# Patient Record
Sex: Male | Born: 1955
Health system: Southern US, Community
[De-identification: ages and names within clinical notes are randomized; demographics above are authoritative.]

## PROBLEM LIST (undated history)

## (undated) DIAGNOSIS — F329 Major depressive disorder, single episode, unspecified: Secondary | ICD-10-CM

## (undated) DIAGNOSIS — K648 Other hemorrhoids: Secondary | ICD-10-CM

## (undated) DIAGNOSIS — K219 Gastro-esophageal reflux disease without esophagitis: Secondary | ICD-10-CM

## (undated) DIAGNOSIS — F32A Depression, unspecified: Secondary | ICD-10-CM

## (undated) DIAGNOSIS — I499 Cardiac arrhythmia, unspecified: Secondary | ICD-10-CM

## (undated) DIAGNOSIS — K449 Diaphragmatic hernia without obstruction or gangrene: Secondary | ICD-10-CM

## (undated) DIAGNOSIS — K589 Irritable bowel syndrome without diarrhea: Secondary | ICD-10-CM

## (undated) DIAGNOSIS — M797 Fibromyalgia: Secondary | ICD-10-CM

## (undated) DIAGNOSIS — F419 Anxiety disorder, unspecified: Secondary | ICD-10-CM

## (undated) HISTORY — DX: Major depressive disorder, single episode, unspecified: F32.9

## (undated) HISTORY — DX: Cardiac arrhythmia, unspecified: I49.9

## (undated) HISTORY — DX: Fibromyalgia: M79.7

## (undated) HISTORY — DX: Gastro-esophageal reflux disease without esophagitis: K21.9

## (undated) HISTORY — DX: Other hemorrhoids: K64.8

## (undated) HISTORY — DX: Anxiety disorder, unspecified: F41.9

## (undated) HISTORY — DX: Irritable bowel syndrome, unspecified: K58.9

## (undated) HISTORY — DX: Depression, unspecified: F32.A

## (undated) HISTORY — DX: Diaphragmatic hernia without obstruction or gangrene: K44.9

---

## 2005-04-17 ENCOUNTER — Encounter: Admission: RE | Admit: 2005-04-17 | Discharge: 2005-04-17 | Payer: Self-pay | Admitting: Internal Medicine

## 2005-05-02 ENCOUNTER — Encounter: Admission: RE | Admit: 2005-05-02 | Discharge: 2005-05-02 | Payer: Self-pay | Admitting: Internal Medicine

## 2007-05-30 ENCOUNTER — Encounter: Payer: Self-pay | Admitting: Gastroenterology

## 2007-05-30 ENCOUNTER — Ambulatory Visit: Payer: Self-pay | Admitting: Gastroenterology

## 2007-05-30 DIAGNOSIS — K589 Irritable bowel syndrome without diarrhea: Secondary | ICD-10-CM

## 2007-05-30 DIAGNOSIS — K219 Gastro-esophageal reflux disease without esophagitis: Secondary | ICD-10-CM | POA: Insufficient documentation

## 2007-05-30 DIAGNOSIS — IMO0001 Reserved for inherently not codable concepts without codable children: Secondary | ICD-10-CM

## 2007-05-30 DIAGNOSIS — F329 Major depressive disorder, single episode, unspecified: Secondary | ICD-10-CM

## 2007-05-30 DIAGNOSIS — F32A Depression, unspecified: Secondary | ICD-10-CM | POA: Insufficient documentation

## 2007-07-04 ENCOUNTER — Ambulatory Visit: Payer: Self-pay | Admitting: Gastroenterology

## 2008-06-30 ENCOUNTER — Ambulatory Visit: Payer: Self-pay | Admitting: Gastroenterology

## 2008-08-13 ENCOUNTER — Ambulatory Visit: Payer: Self-pay | Admitting: Gastroenterology

## 2008-09-07 ENCOUNTER — Telehealth (INDEPENDENT_AMBULATORY_CARE_PROVIDER_SITE_OTHER): Payer: Self-pay

## 2008-09-07 ENCOUNTER — Encounter: Payer: Self-pay | Admitting: Gastroenterology

## 2008-09-07 DIAGNOSIS — K449 Diaphragmatic hernia without obstruction or gangrene: Secondary | ICD-10-CM | POA: Insufficient documentation

## 2008-10-04 ENCOUNTER — Ambulatory Visit: Payer: Self-pay | Admitting: Gastroenterology

## 2008-10-04 ENCOUNTER — Ambulatory Visit (HOSPITAL_COMMUNITY): Admission: RE | Admit: 2008-10-04 | Discharge: 2008-10-04 | Payer: Self-pay | Admitting: Gastroenterology

## 2008-10-11 ENCOUNTER — Encounter: Payer: Self-pay | Admitting: Gastroenterology

## 2008-10-22 HISTORY — PX: LAPAROSCOPIC NISSEN FUNDOPLICATION: SHX1932

## 2008-11-19 ENCOUNTER — Ambulatory Visit (HOSPITAL_COMMUNITY): Admission: RE | Admit: 2008-11-19 | Discharge: 2008-11-20 | Payer: Self-pay | Admitting: General Surgery

## 2008-11-19 HISTORY — PX: LAPAROSCOPIC NISSEN FUNDOPLICATION: SHX1932

## 2008-12-14 ENCOUNTER — Encounter: Payer: Self-pay | Admitting: Gastroenterology

## 2009-01-03 ENCOUNTER — Encounter: Payer: Self-pay | Admitting: Gastroenterology

## 2010-02-21 NOTE — Letter (Signed)
Summary: [facility]   Imported By: Lester [place] 01/27/2009 08:58:25  _____________________________________________________________________  External Attachment:    Type:   Image     Comment:   External Document

## 2010-04-13 IMAGING — CR DG CHEST 2V
2 series · 2 of 2 positions shown · non-contrast
Comparison: None

CLINICAL DATA: Gastroesophageal reflux disease.  Preoperative
evaluation.  Nonsmoker.  No current cardiac or pulmonary
complaints.  History negative for hypertension or diabetes.

CHEST - 2 VIEW

[w chest pa]
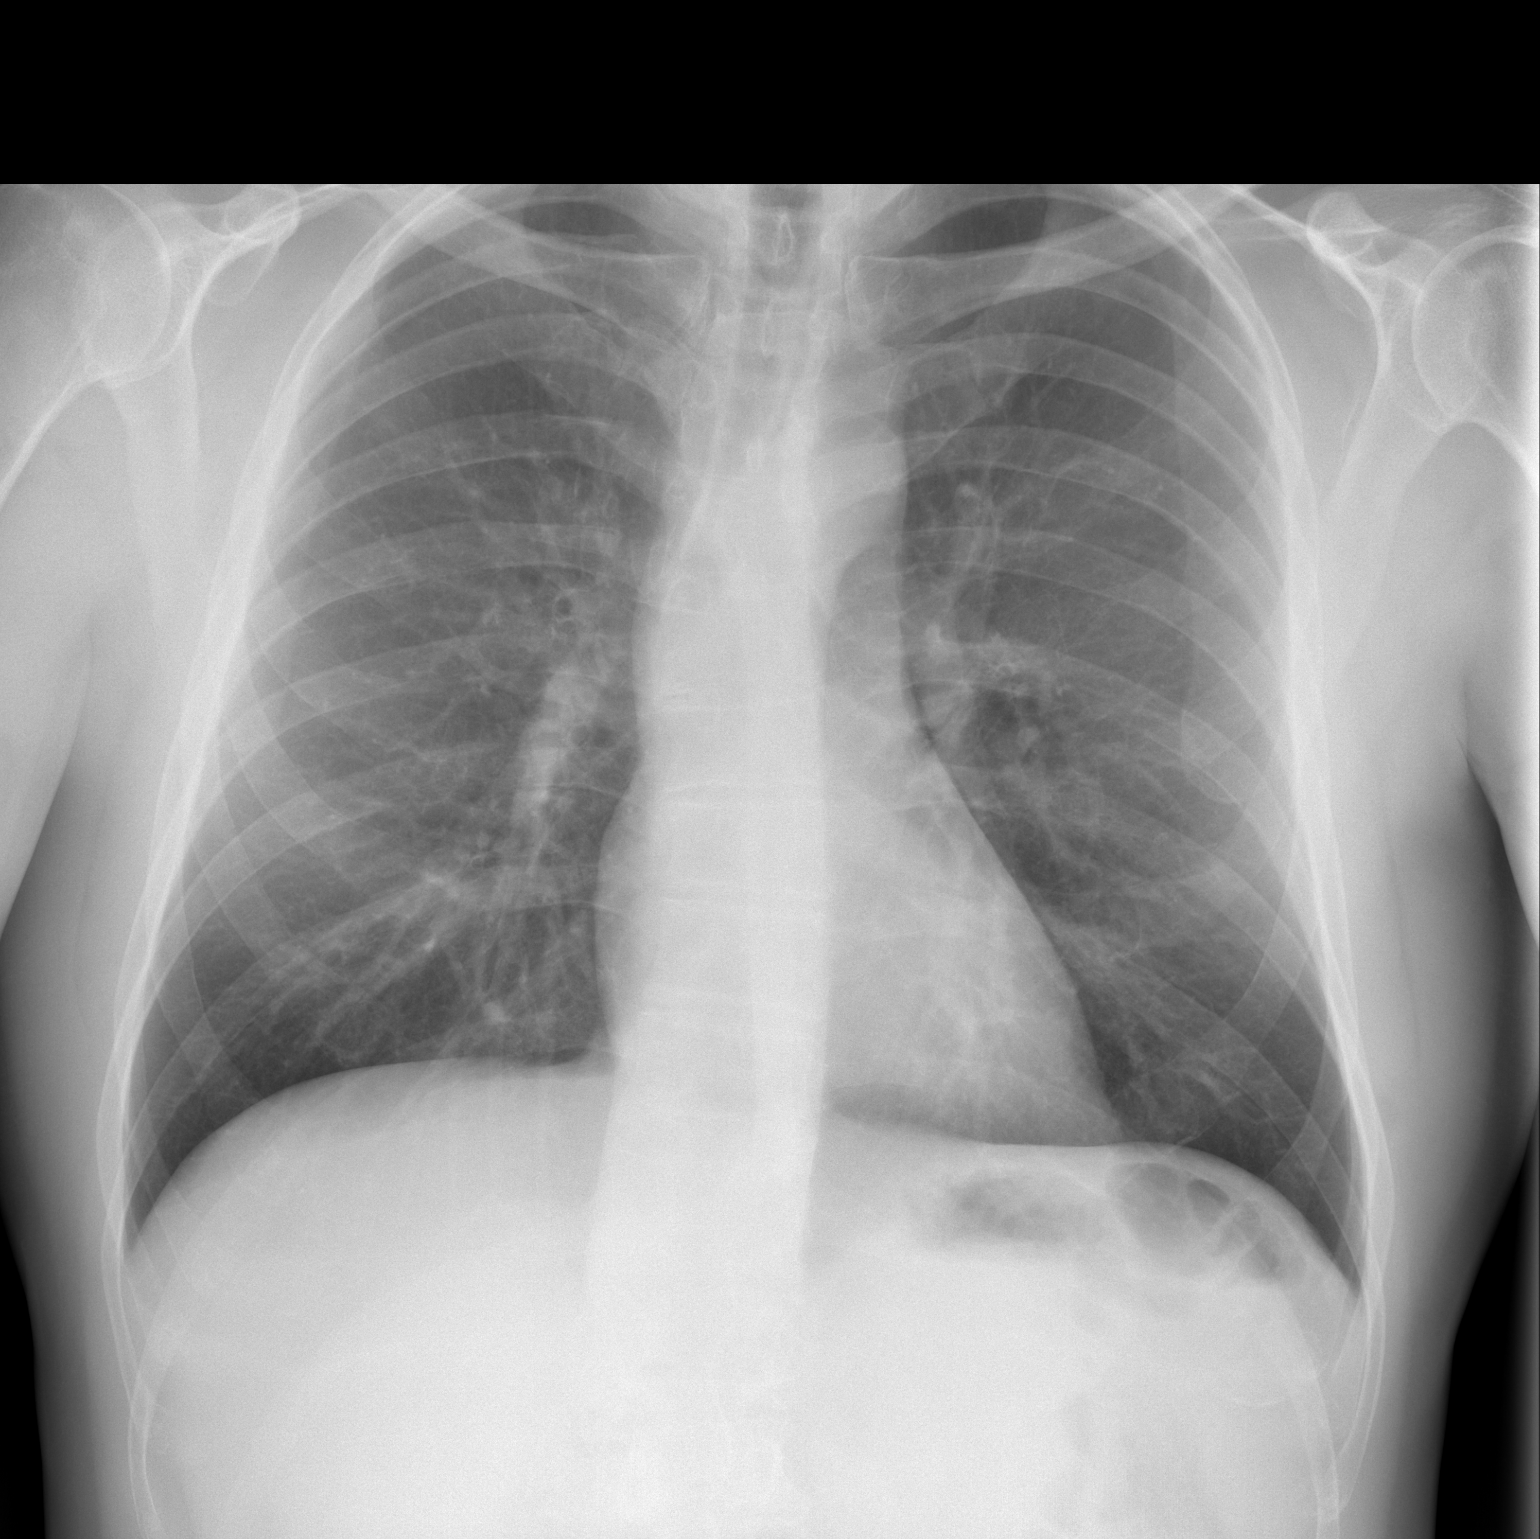

[w chest lat]
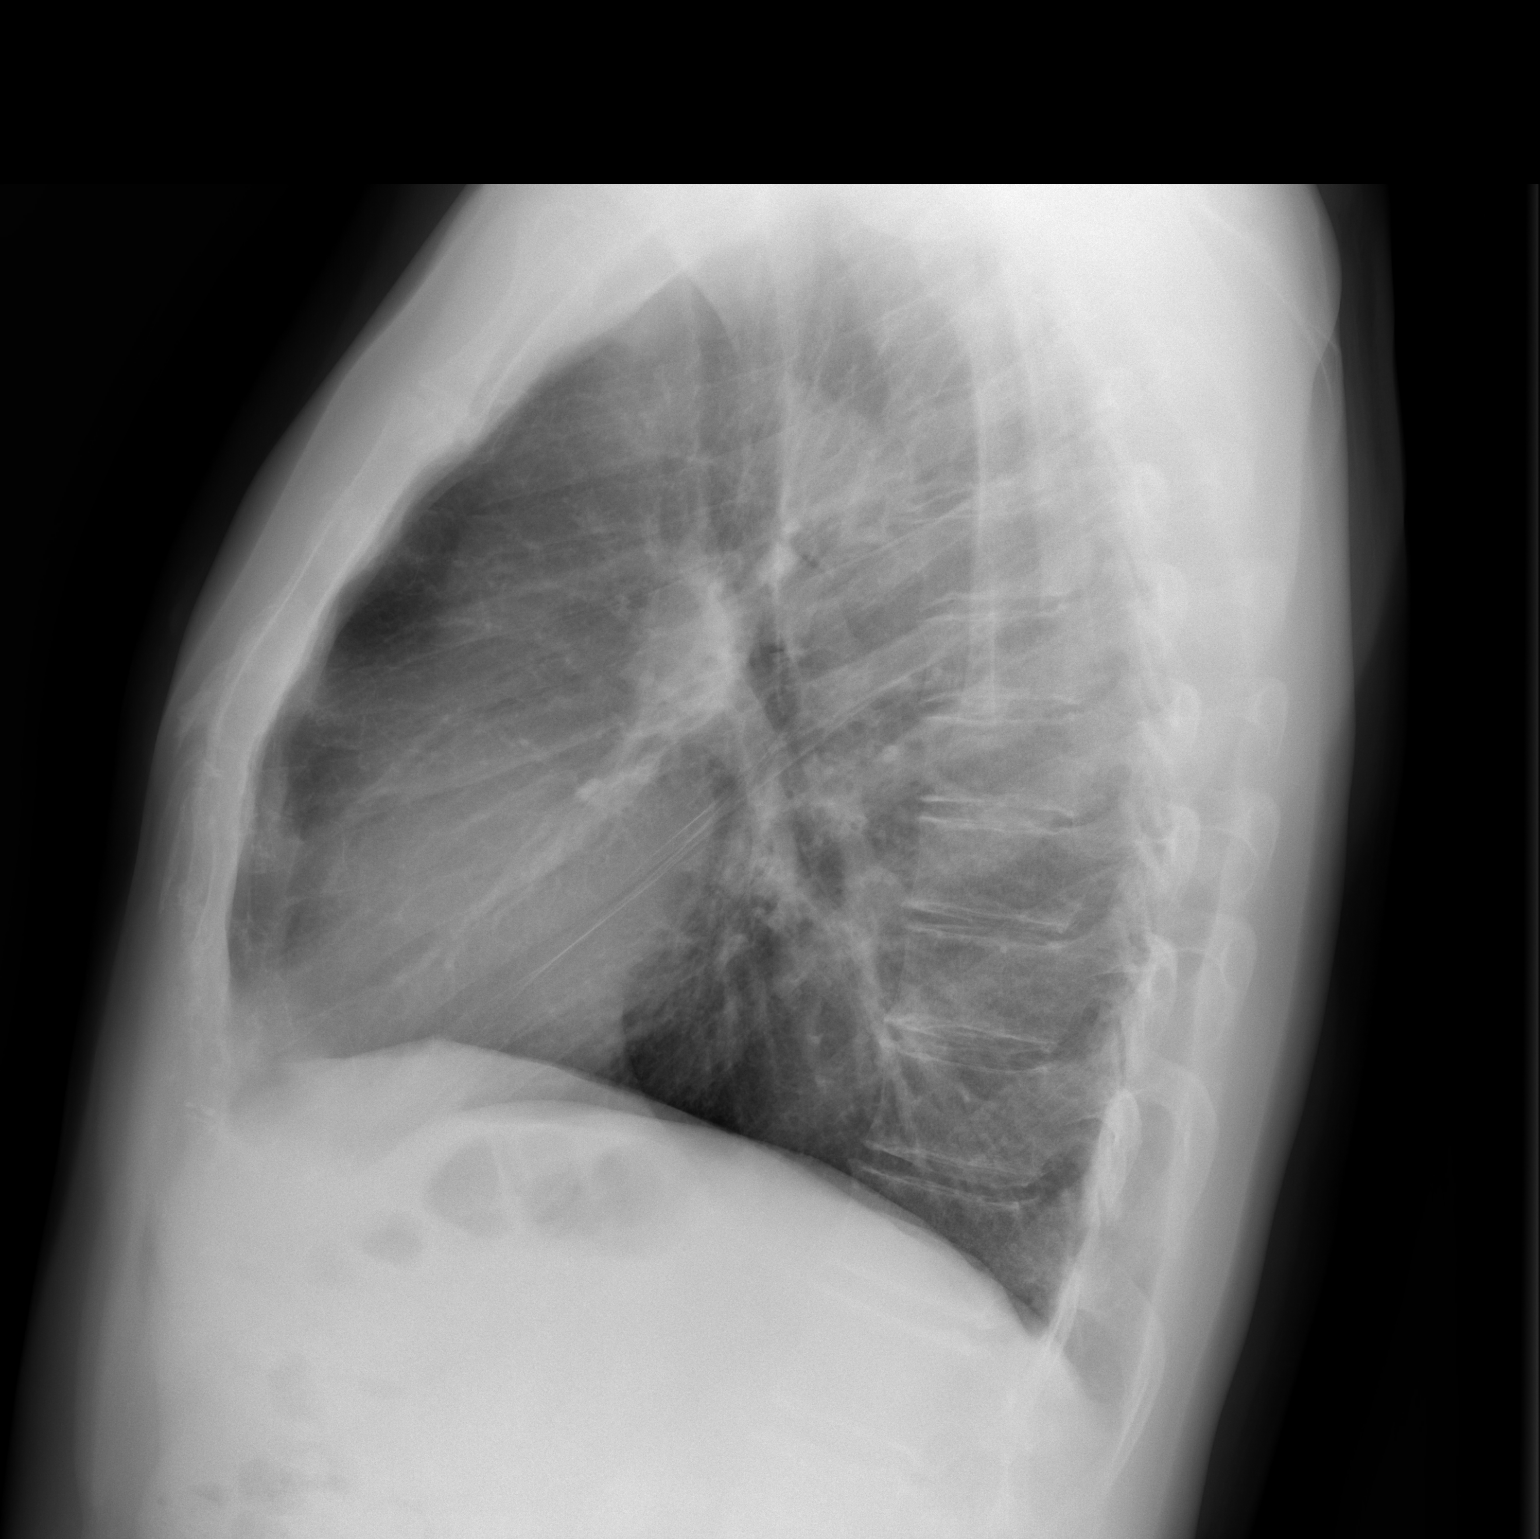

[2 of 2 positions shown; findings below may reference images not displayed]

FINDINGS: The lung fields are well-aerated.  Heart and mediastinal
contours are within normal limits.  No focal infiltrates or signs
of congestive failure are seen.  No pleural fluid is noted. Bony
structures appear intact with very mild curvature of the upper
thoracic spine noted. A mild compensatory curve is seen in the
lower thoracic spine.
IMPRESSION: No acute cardiopulmonary abnormality noted.

## 2010-04-27 LAB — DIFFERENTIAL
Basophils Absolute: 0 K/uL (ref 0.0–0.1)
Basophils Relative: 0 % (ref 0–1)
Eosinophils Absolute: 0.1 K/uL (ref 0.0–0.7)
Eosinophils Relative: 1 % (ref 0–5)
Lymphocytes Relative: 27 % (ref 12–46)
Lymphs Abs: 1.3 K/uL (ref 0.7–4.0)
Monocytes Absolute: 0.4 K/uL (ref 0.1–1.0)
Monocytes Relative: 8 % (ref 3–12)
Neutro Abs: 3.1 K/uL (ref 1.7–7.7)
Neutrophils Relative %: 64 % (ref 43–77)

## 2010-04-27 LAB — COMPREHENSIVE METABOLIC PANEL WITH GFR
ALT: 20 U/L (ref 0–53)
AST: 21 U/L (ref 0–37)
Albumin: 3.9 g/dL (ref 3.5–5.2)
Alkaline Phosphatase: 48 U/L (ref 39–117)
BUN: 13 mg/dL (ref 6–23)
CO2: 25 meq/L (ref 19–32)
Calcium: 9.1 mg/dL (ref 8.4–10.5)
Chloride: 107 meq/L (ref 96–112)
Creatinine, Ser: 0.94 mg/dL (ref 0.4–1.5)
GFR calc non Af Amer: >60 mL/min
Glucose, Bld: 99 mg/dL (ref 70–99)
Potassium: 4.2 meq/L (ref 3.5–5.1)
Sodium: 139 meq/L (ref 135–145)
Total Bilirubin: 0.8 mg/dL (ref 0.3–1.2)
Total Protein: 6.8 g/dL (ref 6.0–8.3)

## 2010-04-27 LAB — CBC
MCHC: 34.9 g/dL (ref 30.0–36.0)
MCV: 92.3 fL (ref 78.0–100.0)
Platelets: 192 10*3/uL (ref 150–400)
RBC: 4.73 MIL/uL (ref 4.22–5.81)
RDW: 13.7 % (ref 11.5–15.5)
WBC: 4.8 10*3/uL (ref 4.0–10.5)

## 2010-04-27 LAB — TYPE AND SCREEN
ABO/RH(D): O POS
Antibody Screen: NEGATIVE

## 2010-04-27 LAB — ABO/RH: ABO/RH(D): O POS

## 2010-07-18 ENCOUNTER — Emergency Department (HOSPITAL_COMMUNITY)
Admission: EM | Admit: 2010-07-18 | Discharge: 2010-07-18 | Disposition: A | Discharge status: Home / Self Care | Payer: BC Managed Care – PPO | Attending: Emergency Medicine | Admitting: Emergency Medicine

## 2010-07-18 DIAGNOSIS — Z23 Encounter for immunization: Secondary | ICD-10-CM | POA: Insufficient documentation

## 2010-07-21 ENCOUNTER — Inpatient Hospital Stay (INDEPENDENT_AMBULATORY_CARE_PROVIDER_SITE_OTHER)
Admission: RE | Admit: 2010-07-21 | Discharge: 2010-07-21 | Disposition: A | Discharge status: Home / Self Care | Payer: BC Managed Care – PPO | Source: Ambulatory Visit | Attending: Family Medicine | Admitting: Family Medicine

## 2010-07-21 DIAGNOSIS — Z23 Encounter for immunization: Secondary | ICD-10-CM

## 2010-07-25 ENCOUNTER — Inpatient Hospital Stay (INDEPENDENT_AMBULATORY_CARE_PROVIDER_SITE_OTHER)
Admission: RE | Admit: 2010-07-25 | Discharge: 2010-07-25 | Disposition: A | Discharge status: Home / Self Care | Payer: BC Managed Care – PPO | Source: Ambulatory Visit | Attending: Family Medicine | Admitting: Family Medicine

## 2010-07-25 DIAGNOSIS — Z23 Encounter for immunization: Secondary | ICD-10-CM

## 2010-08-01 ENCOUNTER — Inpatient Hospital Stay (INDEPENDENT_AMBULATORY_CARE_PROVIDER_SITE_OTHER)
Admission: RE | Admit: 2010-08-01 | Discharge: 2010-08-01 | Disposition: A | Discharge status: Home / Self Care | Payer: BC Managed Care – PPO | Source: Ambulatory Visit | Attending: Emergency Medicine | Admitting: Emergency Medicine

## 2010-08-01 DIAGNOSIS — Z23 Encounter for immunization: Secondary | ICD-10-CM

## 2010-11-03 ENCOUNTER — Other Ambulatory Visit: Payer: Self-pay | Admitting: Internal Medicine

## 2010-11-03 DIAGNOSIS — H9319 Tinnitus, unspecified ear: Secondary | ICD-10-CM

## 2010-11-07 ENCOUNTER — Ambulatory Visit
Admission: RE | Admit: 2010-11-07 | Discharge: 2010-11-07 | Disposition: A | Discharge status: Home / Self Care | Payer: BC Managed Care – PPO | Source: Ambulatory Visit | Attending: Internal Medicine | Admitting: Internal Medicine

## 2010-11-07 DIAGNOSIS — H9319 Tinnitus, unspecified ear: Secondary | ICD-10-CM

## 2010-11-07 MED ORDER — GADOBENATE DIMEGLUMINE 529 MG/ML IV SOLN
17.0000 mL | Freq: Once | INTRAVENOUS | Status: AC | PRN
  Administered 2010-11-07: 17 mL via INTRAVENOUS

## 2010-11-16 ENCOUNTER — Encounter: Payer: Self-pay | Admitting: Gastroenterology

## 2010-11-16 ENCOUNTER — Ambulatory Visit (INDEPENDENT_AMBULATORY_CARE_PROVIDER_SITE_OTHER): Payer: BC Managed Care – PPO | Admitting: Gastroenterology

## 2010-11-16 ENCOUNTER — Other Ambulatory Visit (INDEPENDENT_AMBULATORY_CARE_PROVIDER_SITE_OTHER): Payer: BC Managed Care – PPO

## 2010-11-16 VITALS — BP 124/76 | HR 90 | Ht 74.0 in | Wt 190.0 lb

## 2010-11-16 DIAGNOSIS — R197 Diarrhea, unspecified: Secondary | ICD-10-CM

## 2010-11-16 MED ORDER — RIFAXIMIN 550 MG PO TABS: 550.0000 mg | ORAL_TABLET | Freq: Two times a day (BID) | ORAL | Status: DC

## 2010-11-16 NOTE — Progress Notes (Signed)
History of Present Illness: This is a 55 year old male that I seen in the past. He underwent screening colonoscopy in July 2010 showing small internal hemorrhoids. He has a long history of constipation predominant irritable bowel syndrome however approximately 2 years ago he developed mild diarrhea which increased in severity over the course of several months to 5 or 6 loose, watery , urgent bowel movements daily. He was evaluated on several occasions by Dr. Ananias Pilgrim. I have reviewed the records he provided. He was treated with a course of Xifaxan for possible bacterial overgrowth and had some improvement in bloating but his diarrhea has not changed. He had microscopic stool analyses done by Dr. Ananias Pilgrim, through a veterinarian's office, showing Giardia and tapeworm. He was treated with 2 courses of antibiotics for both infections with no change in his diarrhea. He states he is a 15 pound weight loss over the past 2 years. He placed himself on a gluten-free diet which helps but did not eliminate his diarrhea. He also avoids high fiber foods, fresh fruits and vegetables and milk products which all help to control his diarrhea. He currently has about 3 loose bowel movements per day and he occasionally notes a small amount of bright red blood per rectum he attributes to hemorrhoids. He denies any travel outside the country in the past 7-10 years. Denies weight loss, abdominal pain, constipation, diarrhea, change in stool caliber, melena, hematochezia, nausea, vomiting, dysphagia, reflux symptoms, chest pain.  Current Medications, Allergies, Past Medical History, Past Surgical History, Family History and Social History were reviewed in [employer] record.  Physical Exam: General: Well developed , well nourished, no acute distress Head: Normocephalic and atraumatic Eyes:  sclerae anicteric, EOMI Ears: Normal auditory acuity Mouth: No deformity or lesions Lungs: Clear throughout to  auscultation Heart: Regular rate and rhythm; no murmurs, rubs or bruits Abdomen: Soft, non tender and non distended. No masses, hepatosplenomegaly or hernias noted. Normal Bowel sounds Musculoskeletal: Symmetrical with no gross deformities  Pulses:  Normal pulses noted Extremities: No clubbing, cyanosis, edema or deformities noted Neurological: Alert oriented x 4, grossly nonfocal Psychological:  Alert and cooperative. Normal mood and affect  Assessment and Recommendations:  1. Chronic diarrhea. Possible celiac disease with incomplete removal of gluten-free from his diet. Rule out a persistent Giardia or tapeworm infection, inflammatory bowel disease, malabsorption, bacterial overgrowth and microscopic colitis. Repeat standard stool studies and a fecal elastase. Proceed with another course of Xifaxan after stool studies are obtained. Obtain TSH and a celiac antibody profile. Consider colonoscopy and EGD.

## 2010-11-16 NOTE — Patient Instructions (Signed)
Your physician has requested that you go to the basement for the following lab work before leaving today:  You have been given Hemoccult cards, please follow the instructions on the cards and mail back to this office.   We have given you instructions for your breath test scheduled for tomorrow morning.   Dr. Russella Dar would like you to follow up with him in 2-3 weeks.   cc: Maryclare Bean.D.

## 2010-11-17 LAB — CELIAC PANEL 10
Endomysial Screen: NEGATIVE
Gliadin IgA: 9.5 U/mL (ref <20)
Gliadin IgG: 4.4 U/mL (ref <20)
Tissue Transglut Ab: 6.3 U/mL (ref <20)

## 2014-07-09 ENCOUNTER — Encounter: Payer: Self-pay | Admitting: Gastroenterology

## 2016-10-23 ENCOUNTER — Observation Stay (HOSPITAL_COMMUNITY)
Admission: EM | Admit: 2016-10-23 | Discharge: 2016-10-24 | Disposition: A | Discharge status: Home / Self Care | Payer: BLUE CROSS/BLUE SHIELD | Attending: Family Medicine | Admitting: Family Medicine

## 2016-10-23 ENCOUNTER — Emergency Department (HOSPITAL_COMMUNITY): Payer: BLUE CROSS/BLUE SHIELD

## 2016-10-23 ENCOUNTER — Encounter (HOSPITAL_COMMUNITY): Payer: Self-pay | Admitting: Emergency Medicine

## 2016-10-23 DIAGNOSIS — F419 Anxiety disorder, unspecified: Secondary | ICD-10-CM | POA: Insufficient documentation

## 2016-10-23 DIAGNOSIS — R002 Palpitations: Secondary | ICD-10-CM | POA: Diagnosis not present

## 2016-10-23 DIAGNOSIS — R778 Other specified abnormalities of plasma proteins: Secondary | ICD-10-CM

## 2016-10-23 DIAGNOSIS — R748 Abnormal levels of other serum enzymes: Secondary | ICD-10-CM | POA: Diagnosis present

## 2016-10-23 DIAGNOSIS — Z8249 Family history of ischemic heart disease and other diseases of the circulatory system: Secondary | ICD-10-CM | POA: Diagnosis not present

## 2016-10-23 DIAGNOSIS — K219 Gastro-esophageal reflux disease without esophagitis: Secondary | ICD-10-CM | POA: Insufficient documentation

## 2016-10-23 DIAGNOSIS — R0602 Shortness of breath: Secondary | ICD-10-CM | POA: Diagnosis present

## 2016-10-23 DIAGNOSIS — Z79811 Long term (current) use of aromatase inhibitors: Secondary | ICD-10-CM | POA: Diagnosis not present

## 2016-10-23 DIAGNOSIS — Z79899 Other long term (current) drug therapy: Secondary | ICD-10-CM | POA: Insufficient documentation

## 2016-10-23 DIAGNOSIS — E785 Hyperlipidemia, unspecified: Secondary | ICD-10-CM | POA: Insufficient documentation

## 2016-10-23 DIAGNOSIS — R000 Tachycardia, unspecified: Secondary | ICD-10-CM | POA: Diagnosis not present

## 2016-10-23 DIAGNOSIS — R7989 Other specified abnormal findings of blood chemistry: Secondary | ICD-10-CM | POA: Diagnosis present

## 2016-10-23 DIAGNOSIS — F329 Major depressive disorder, single episode, unspecified: Secondary | ICD-10-CM | POA: Insufficient documentation

## 2016-10-23 LAB — BRAIN NATRIURETIC PEPTIDE: B NATRIURETIC PEPTIDE 5: 110.1 pg/mL — AB (ref 0.0–100.0)

## 2016-10-23 LAB — BASIC METABOLIC PANEL
ANION GAP: 5 (ref 5–15)
BUN: 18 mg/dL (ref 6–20)
CHLORIDE: 107 mmol/L (ref 101–111)
CO2: 22 mmol/L (ref 22–32)
Calcium: 9 mg/dL (ref 8.9–10.3)
Creatinine, Ser: 1.04 mg/dL (ref 0.61–1.24)
Glucose, Bld: 133 mg/dL — ABNORMAL HIGH (ref 65–99)
POTASSIUM: 3.8 mmol/L (ref 3.5–5.1)
SODIUM: 134 mmol/L — AB (ref 135–145)

## 2016-10-23 LAB — CBC
HEMATOCRIT: 40 % (ref 39.0–52.0)
Hemoglobin: 13.9 g/dL (ref 13.0–17.0)
MCH: 31.2 pg (ref 26.0–34.0)
MCHC: 34.8 g/dL (ref 30.0–36.0)
MCV: 89.9 fL (ref 78.0–100.0)
Platelets: 200 10*3/uL (ref 150–400)
RBC: 4.45 MIL/uL (ref 4.22–5.81)
RDW: 13.2 % (ref 11.5–15.5)
WBC: 5.3 10*3/uL (ref 4.0–10.5)

## 2016-10-23 LAB — I-STAT TROPONIN, ED: Troponin i, poc: 0.03 ng/mL (ref 0.00–0.08)

## 2016-10-23 NOTE — ED Triage Notes (Signed)
Pt. Stated, I woke up this morning with some palpitations, sweaty, and SOB. Went to see my doctor and he said to come here my Troponin was abnormal.

## 2016-10-24 DIAGNOSIS — R002 Palpitations: Secondary | ICD-10-CM | POA: Diagnosis not present

## 2016-10-24 DIAGNOSIS — R748 Abnormal levels of other serum enzymes: Secondary | ICD-10-CM

## 2016-10-24 DIAGNOSIS — R7989 Other specified abnormal findings of blood chemistry: Secondary | ICD-10-CM

## 2016-10-24 DIAGNOSIS — K219 Gastro-esophageal reflux disease without esophagitis: Secondary | ICD-10-CM | POA: Diagnosis not present

## 2016-10-24 DIAGNOSIS — R000 Tachycardia, unspecified: Secondary | ICD-10-CM | POA: Insufficient documentation

## 2016-10-24 DIAGNOSIS — R778 Other specified abnormalities of plasma proteins: Secondary | ICD-10-CM | POA: Diagnosis present

## 2016-10-24 DIAGNOSIS — F419 Anxiety disorder, unspecified: Secondary | ICD-10-CM

## 2016-10-24 DIAGNOSIS — R0602 Shortness of breath: Secondary | ICD-10-CM | POA: Diagnosis present

## 2016-10-24 LAB — LIPID PANEL
CHOLESTEROL: 174 mg/dL (ref 0–200)
HDL: 54 mg/dL (ref >40)
LDL Cholesterol: 114 mg/dL — ABNORMAL HIGH (ref 0–99)
TRIGLYCERIDES: 32 mg/dL (ref <150)
Total CHOL/HDL Ratio: 3.2 RATIO
VLDL: 6 mg/dL (ref 0–40)

## 2016-10-24 LAB — RAPID URINE DRUG SCREEN, HOSP PERFORMED
Amphetamines: NOT DETECTED
BARBITURATES: NOT DETECTED
Benzodiazepines: POSITIVE — AB
COCAINE: NOT DETECTED
Opiates: NOT DETECTED
Tetrahydrocannabinol: NOT DETECTED

## 2016-10-24 LAB — HEPARIN LEVEL (UNFRACTIONATED): HEPARIN UNFRACTIONATED: 0.56 [IU]/mL (ref 0.30–0.70)

## 2016-10-24 LAB — TSH: TSH: 3.571 u[IU]/mL (ref 0.350–4.500)

## 2016-10-24 LAB — TROPONIN I
Troponin I: 0.05 ng/mL (ref <0.03)
Troponin I: 0.04 ng/mL (ref <0.03)
Troponin I: 0.03 ng/mL (ref <0.03)

## 2016-10-24 LAB — HEMOGLOBIN A1C
HEMOGLOBIN A1C: 5.2 % (ref 4.8–5.6)
Mean Plasma Glucose: 102.54 mg/dL

## 2016-10-24 LAB — T4, FREE: Free T4: 0.74 ng/dL (ref 0.61–1.12)

## 2016-10-24 LAB — HIV ANTIBODY (ROUTINE TESTING W REFLEX): HIV SCREEN 4TH GENERATION: NONREACTIVE

## 2016-10-24 MED ORDER — ASPIRIN 81 MG PO CHEW
324.0000 mg | CHEWABLE_TABLET | Freq: Once | ORAL | Status: AC
  Administered 2016-10-24: 324 mg via ORAL
  Filled 2016-10-24: qty 4

## 2016-10-24 MED ORDER — ALPRAZOLAM 0.25 MG PO TABS: 0.2500 mg | ORAL_TABLET | Freq: Two times a day (BID) | ORAL | Status: DC | PRN

## 2016-10-24 MED ORDER — SODIUM CHLORIDE 0.9 % IV SOLN
INTRAVENOUS | Status: DC
  Administered 2016-10-24: 04:00:00 via INTRAVENOUS

## 2016-10-24 MED ORDER — ALBUTEROL SULFATE (2.5 MG/3ML) 0.083% IN NEBU: 2.5000 mg | INHALATION_SOLUTION | Freq: Four times a day (QID) | RESPIRATORY_TRACT | Status: DC | PRN

## 2016-10-24 MED ORDER — ONDANSETRON HCL 4 MG/2ML IJ SOLN: 4.0000 mg | Freq: Four times a day (QID) | INTRAMUSCULAR | Status: DC | PRN

## 2016-10-24 MED ORDER — TEMAZEPAM 15 MG PO CAPS: 15.0000 mg | ORAL_CAPSULE | Freq: Every evening | ORAL | Status: DC | PRN

## 2016-10-24 MED ORDER — HEPARIN BOLUS VIA INFUSION
4500.0000 [IU] | Freq: Once | INTRAVENOUS | Status: AC
  Administered 2016-10-24: 4500 [IU] via INTRAVENOUS
  Filled 2016-10-24: qty 4500

## 2016-10-24 MED ORDER — MIRTAZAPINE 15 MG PO TABS
30.0000 mg | ORAL_TABLET | Freq: Every day | ORAL | Status: DC
  Filled 2016-10-24: qty 1

## 2016-10-24 MED ORDER — HYDRALAZINE HCL 20 MG/ML IJ SOLN: 5.0000 mg | INTRAMUSCULAR | Status: DC | PRN

## 2016-10-24 MED ORDER — HEPARIN (PORCINE) IN NACL 100-0.45 UNIT/ML-% IJ SOLN
1300.0000 [IU]/h | INTRAMUSCULAR | Status: DC
  Administered 2016-10-24: 1300 [IU]/h via INTRAVENOUS
  Filled 2016-10-24: qty 250

## 2016-10-24 MED ORDER — PANTOPRAZOLE SODIUM 40 MG PO TBEC: 40.0000 mg | DELAYED_RELEASE_TABLET | Freq: Every day | ORAL | Status: DC | PRN

## 2016-10-24 MED ORDER — NITROGLYCERIN 0.4 MG SL SUBL: 0.4000 mg | SUBLINGUAL_TABLET | SUBLINGUAL | Status: DC | PRN

## 2016-10-24 MED ORDER — ACETAMINOPHEN 325 MG PO TABS: 650.0000 mg | ORAL_TABLET | ORAL | Status: DC | PRN

## 2016-10-24 MED ORDER — MAGNESIUM OXIDE 400 (241.3 MG) MG PO TABS
200.0000 mg | ORAL_TABLET | Freq: Every day | ORAL | Status: DC
  Filled 2016-10-24: qty 0.5

## 2016-10-24 MED ORDER — ZOLPIDEM TARTRATE 5 MG PO TABS: 10.0000 mg | ORAL_TABLET | Freq: Every evening | ORAL | Status: DC | PRN

## 2016-10-24 MED ORDER — ASPIRIN 81 MG PO CHEW
324.0000 mg | CHEWABLE_TABLET | Freq: Every day | ORAL | Status: DC
  Filled 2016-10-24: qty 4

## 2016-10-24 NOTE — Consult Note (Signed)
Cardiology Consultation:   Patient ID: Calvin Conner; [id number]; August 21, 1955   Admit date: 10/23/2016 Date of Consult: 10/24/2016  Primary Care Provider: Lollie Sails, MD Primary Cardiologist: new - Dr. Debara Pickett Primary Electrophysiologist:     Patient Profile:   Calvin Conner is a 61 y.o. male with a hx of GERD, IBS, fibromyalgia, and depression who is being seen today for the evaluation of palpitations and elevated troponin at the request of Dr. Blaine Hamper.  History of Present Illness:   [name] states that he has had palpitations/heart racing for the past 2-3 years. Palpitations occur about 3-4 times each year and last about 5-10 minutes before spontaneously resolving. He has not sought medical management for these palpitations.  His father has a history of Afib, no family history of heart disease.  Yesterday morning, he started having a similar episode of palpitations/heart racing; however, this episode lasted about 3 hours. He went to his PCP at [facility] He was told he had an abnormal EKG. Troponin came back mildly positive at 0.12 (per patient) and he was instructed to report to the [facility].     On arrival, EKG with NSR. Troponin was mildly elevated and flat, inconsistent with ACS and like due to 3 hours of tachycardia.   He denies chest pain, SOB, dizziness, lightheadedness, nausea, vomiting, diaphoresis, and syncope. He states that during the episode, he became dyspneic on exertion. Currenlty, he is resting comfortably and is NSR.  EKG obtained from [facility] with NSR in the 70s, possible left atrial enlargement.  Past Medical History:  Diagnosis Date  . Depression   . Fibromyalgia   . GERD (gastroesophageal reflux disease)    w/ LA Grade A erosive esophagitis  . Hiatal hernia   . IBS (irritable bowel syndrome)   . Internal hemorrhoids     Past Surgical History:  Procedure Laterality Date  . LAPAROSCOPIC NISSEN FUNDOPLICATION  01/6008     Home  Medications:  Prior to Admission medications   Medication Sig Start Date End Date Taking? Authorizing Provider  MAGNESIUM PO Take 1 tablet by mouth every morning.   Yes [provider]  mirtazapine (REMERON) 30 MG tablet Take 30 mg by mouth at bedtime.   Yes [provider]  temazepam (RESTORIL) 15 MG capsule Take 15 mg by mouth at bedtime as needed for sleep.   Yes [provider]  zolpidem (AMBIEN) 10 MG tablet Take 10 mg by mouth at bedtime as needed for sleep.   Yes [provider]    Inpatient Medications: Scheduled Meds: . aspirin  324 mg Oral Daily  . magnesium oxide  200 mg Oral Daily  . mirtazapine  30 mg Oral QHS   Continuous Infusions: . sodium chloride 75 mL/hr at 10/24/16 0424  . heparin 1,300 Units/hr (10/24/16 0423)   PRN Meds: acetaminophen, albuterol, ALPRAZolam, hydrALAZINE, nitroGLYCERIN, ondansetron (ZOFRAN) IV, pantoprazole, temazepam, zolpidem  Allergies:    Allergies  Allergen Reactions  . Penicillins Other (See Comments)    Childhood allergy     Social History:   Social History   Social History  . Marital status: Married    Spouse name: N/A  . Number of children: 2  . Years of education: N/A   Occupational History  . Not on file.   Social History Main Topics  . Smoking status: Never Smoker  . Smokeless tobacco: Never Used  . Alcohol use No  . Drug use: No  . Sexual activity: Not  on file   Other Topics Concern  . Not on file   Social History Narrative  . No narrative on file    Family History:    Family History  Problem Relation Age of Onset  . Breast cancer Mother   . Colon cancer Neg Hx      ROS:  Please see the history of present illness.  ROS  All other ROS reviewed and negative.     Physical Exam/Data:   Vitals:   10/24/16 0900 10/24/16 0930 10/24/16 1030 10/24/16 1100  BP: 113/79 112/77 115/85 (!) 128/94  Pulse: 62 63 78 70  Resp: 14 13 (!) 21 19  Temp:      TempSrc:      SpO2:  97% 96% 97% 97%  Weight:      Height:       No intake or output data in the 24 hours ending 10/24/16 1104 Filed Weights   10/23/16 1902  Weight: 200 lb (90.7 kg)   Body mass index is 25.68 kg/m.  General:  Well nourished, well developed, in no acute distress HEENT: normal Neck: no JVD Vascular: No carotid bruits Cardiac:  normal S1, S2; RRR; no murmur Lungs:  clear to auscultation bilaterally, no wheezing, rhonchi or rales  Abd: soft, nontender, no hepatomegaly  Ext: no edema Musculoskeletal:  No deformities, BUE and BLE strength normal and equal Skin: warm and dry  Neuro:  CNs 2-12 intact, no focal abnormalities noted Psych:  Normal affect   EKG:  The EKG was personally reviewed and demonstrates:  NSR Telemetry:  Telemetry was personally reviewed and demonstrates:  NSR  Relevant CV Studies:  Echocardiogram: pending  Laboratory Data:  Chemistry Recent Labs Lab 10/23/16 1924  NA 134*  K 3.8  CL 107  CO2 22  GLUCOSE 133*  BUN 18  CREATININE 1.04  CALCIUM 9.0  GFRNONAA >60  GFRAA >60  ANIONGAP 5    No results for input(s): PROT, ALBUMIN, AST, ALT, ALKPHOS, BILITOT in the last 168 hours. Hematology Recent Labs Lab 10/23/16 1924  WBC 5.3  RBC 4.45  HGB 13.9  HCT 40.0  MCV 89.9  MCH 31.2  MCHC 34.8  RDW 13.2  PLT 200   Cardiac Enzymes Recent Labs Lab 10/23/16 2303 10/24/16 0303 10/24/16 0914  TROPONINI 0.05* 0.04* 0.03*    Recent Labs Lab 10/23/16 1943  TROPIPOC 0.03    BNP Recent Labs Lab 10/23/16 2303  BNP 110.1*    DDimer No results for input(s): DDIMER in the last 168 hours.  Radiology/Studies:  Dg Chest 2 View  Result Date: 10/23/2016 CLINICAL DATA:  61 year old male with history of shortness of breath. Rapid heart rate. EXAM: CHEST  2 VIEW COMPARISON:  Chest x-ray 10/23/2016. FINDINGS: The heart size and mediastinal contours are within normal limits. Both lungs are clear. The visualized skeletal structures are unremarkable.  IMPRESSION: No active cardiopulmonary disease. Electronically Signed   By: Vinnie Langton M.D.   On: 10/23/2016 20:39    Assessment and Plan:   1. Palpitations - primary team started heparin - TSH WNL, K WNL, Mg pending - E[facility] obtained from [facility] obtained yesterday with NSR and possible left atrial enlargement - echo pending - will monitor on telemetry today - if no further arrhythmia or tachycardia, consider implantable loop recorder - he is currently not on rate controlling medications   2. Elevated troponin - troponin 0.03 --> 0.05 --> 0.04 --> 0.03 - EKG without signs of acute ischemia - BNP 110.1 -  echo pending Echo results will guide decisions concerning ischemic evaluation.   3. HLD - LDL 114, Triglycerides 32, HDL 54 - would benefit from a statin medication if lifestyle modifications do not lower LDL    For questions or updates, please contact [place] Please consult [email or web address] for contact info under Cardiology/STEMI.   Signed, Tami Lin Carlon Davidson, PA  10/24/2016 11:04 AM

## 2016-10-24 NOTE — ED Notes (Addendum)
Spoke with MD Sarajane Jews who states he spoke with cardiology and they are aware of pt and plan to see pt.

## 2016-10-24 NOTE — Progress Notes (Signed)
Received consult from Dr Debara Pickett to consider ILR for evaluation of infrequent palpitations and family history of AF.  Unfortunately, we are not able to implant today.  I have discussed with patient and we will plan ILR implant Friday morning. Pt to arrive at 6:30AM to short stay.  Full consult note will be done then with Dr Lovena Le.  Ok per Dr Debara Pickett to discharge to home with outpatient follow up. No reason from EP standpoint to admit patient. I have reached out to admitting MD to let them know.  Chanetta Marshall, NP 10/24/2016 1:54 PM

## 2016-10-24 NOTE — Progress Notes (Signed)
ANTICOAGULATION CONSULT NOTE - Initial Consult  Pharmacy Consult for heparin Indication: new afib, elevated troponin   Allergies  Allergen Reactions  . Penicillins Other (See Comments)    Childhood allergy     Patient Measurements: Height: 6\' 2"  (188 cm) Weight: 200 lb (90.7 kg) IBW/kg (Calculated) : 82.2 Heparin Dosing Weight: 90.7 kg   Vital Signs: Temp: 98.1 F (36.7 C) (10/02 1901) Temp Source: Oral (10/02 1901) BP: 128/85 (10/03 0230) Pulse Rate: 68 (10/03 0230)  Labs:  Recent Labs  10/23/16 1924 10/23/16 2303  HGB 13.9  --   HCT 40.0  --   PLT 200  --   CREATININE 1.04  --   TROPONINI  --  0.05*    Estimated Creatinine Clearance: 84.5 mL/min (by C-G formula based on SCr of 1.04 mg/dL).   Medical History: Past Medical History:  Diagnosis Date  . Depression   . Fibromyalgia   . GERD (gastroesophageal reflux disease)    w/ LA Grade A erosive esophagitis  . Hiatal hernia   . IBS (irritable bowel syndrome)   . Internal hemorrhoids     Assessment: 61 yo male admitted with SOB and elevated troponin. Pharmacy consulted to dose heparin for possible new onset afib and elevated troponin. No oral anticoagulation PTA per medication list.    CBC stable and no s/s bleeding noted.   Goal of Therapy:  Heparin level 0.3-0.7 units/ml Monitor platelets by anticoagulation protocol: Yes   Plan:  Heparin 4500 units IV x1 Start heparin gtt at 1300 units/hr Heparin level in 6 hrs Daily heparin level and CBC Monitor for s/s bleeding   Argie Ramming, PharmD Clinical Pharmacist 10/24/16 3:19 AM

## 2016-10-24 NOTE — ED Notes (Signed)
Family at bedside. 

## 2016-10-24 NOTE — ED Notes (Signed)
Admitting paged because pt would like an update on plan of care.

## 2016-10-24 NOTE — H&P (Signed)
History and Physical    Calvin Conner:[id number] DOB: 1955/02/13 DOA: 10/23/2016  Referring MD/NP/PA:   PCP: Lollie Sails, MD   Patient coming from:  The patient is coming from home.  At baseline, pt is independent for most of ADL.  Chief Complaint: Palpitation, shortness of breath  HPI: Calvin Conner is a 61 y.o. male with medical history significant of GERD, depression, anxiety, fibromyalgia, IBS, who presents with palpitations and shortness breath.  Patient states that he started having palpitation, heart racing in this morning, which lasted for about 3 hours. It is associated with shortness of breath and diaphoresis. No chest pain, cough, fever or chills. Patient was seen by PCP, found to have positive troponin 0.12. Patient had EKG done in PCPs office, which showed "irregular heartbeat and possible A fib" per pt. Patient denies GI symptoms, symptoms of UTI or unilateral weakness. Currently no chest pain or shortness breath.  ED Course: pt was found to have troponin 0.05, BNP 110.1, WBC 5.3, electrolytes renal function okay, temperature normal, tachycardia, O2 sat 96% on room air, negative chest x-ray. Patient is placed on telemetry bed for observation.  Review of Systems:   General: no fevers, chills, no body weight gain, has fatigue HEENT: no blurry vision, hearing changes or sore throat Respiratory: has dyspnea, no coughing, wheezing CV: no chest pain, has palpitations GI: no nausea, vomiting, abdominal pain, diarrhea, constipation GU: no dysuria, burning on urination, increased urinary frequency, hematuria  Ext: no leg edema Neuro: no unilateral weakness, numbness, or tingling, no vision change or hearing loss Skin: no rash, no skin tear. MSK: No muscle spasm, no deformity, no limitation of range of movement in spin Heme: No easy bruising.  Travel history: No recent long distant travel.  Allergy:  Allergies  Allergen Reactions  . Penicillins Other (See Comments)      Childhood allergy     Past Medical History:  Diagnosis Date  . Depression   . Fibromyalgia   . GERD (gastroesophageal reflux disease)    w/ LA Grade A erosive esophagitis  . Hiatal hernia   . IBS (irritable bowel syndrome)   . Internal hemorrhoids     Past Surgical History:  Procedure Laterality Date  . LAPAROSCOPIC NISSEN FUNDOPLICATION  58/5277    Social History:  reports that he has never smoked. He has never used smokeless tobacco. He reports that he does not drink alcohol or use drugs.  Family History:  Family History  Problem Relation Age of Onset  . Breast cancer Mother   . Colon cancer Neg Hx      Prior to Admission medications   Medication Sig Start Date End Date Taking? Authorizing Provider  ALINIA 500 MG tablet TAKES 2 TABLETS BY MOUTH TWICE DAILY  11/10/10   [provider]  AMBULATORY NON FORMULARY MEDICATION TESTOSTERONE INJ 0.7 ONCE A WEEK     [provider]  anastrozole (ARIMIDEX) 1 MG tablet Take 1 mg by mouth daily.      [provider]  methylphenidate (RITALIN) 20 MG tablet Take 20 mg by mouth daily.      [provider]  rifaximin (XIFAXAN) 550 MG TABS Take 1 tablet (550 mg total) by mouth 2 (two) times daily. 11/16/10   Ladene Artist, MD    Physical Exam: Vitals:   10/24/16 0200 10/24/16 0230 10/24/16 0300 10/24/16 0330  BP: 119/78 128/85 113/78 121/74  Pulse: (!) 58 68 (!) 59 (!) 58  Resp: 14 16 15  13  Temp:      TempSrc:      SpO2: 98% 97% 99% 97%  Weight:      Height:       General: Not in acute distress HEENT:       Eyes: PERRL, EOMI, no scleral icterus.       ENT: No discharge from the ears and nose, no pharynx injection, no tonsillar enlargement.        Neck: No JVD, no bruit, no mass felt. Heme: No neck lymph node enlargement. Cardiac: S1/S2, RRR, No murmurs, No gallops or rubs. Respiratory:  No rales, wheezing, rhonchi or rubs. GI: Soft, nondistended, nontender, no rebound pain, no  organomegaly, BS present. GU: No hematuria Ext: No pitting leg edema bilaterally. 2+DP/PT pulse bilaterally. Musculoskeletal: No joint deformities, No joint redness or warmth, no limitation of ROM in spin. Skin: No rashes.  Neuro: Alert, oriented X3, cranial nerves II-XII grossly intact, moves all extremities normally. Psych: Patient is not psychotic, no suicidal or hemocidal ideation.  Labs on Admission: I have personally reviewed following labs and imaging studies  CBC:  Recent Labs Lab 10/23/16 1924  WBC 5.3  HGB 13.9  HCT 40.0  MCV 89.9  PLT 829   Basic Metabolic Panel:  Recent Labs Lab 10/23/16 1924  NA 134*  K 3.8  CL 107  CO2 22  GLUCOSE 133*  BUN 18  CREATININE 1.04  CALCIUM 9.0   GFR: Estimated Creatinine Clearance: 84.5 mL/min (by C-G formula based on SCr of 1.04 mg/dL). Liver Function Tests: No results for input(s): AST, ALT, ALKPHOS, BILITOT, PROT, ALBUMIN in the last 168 hours. No results for input(s): LIPASE, AMYLASE in the last 168 hours. No results for input(s): AMMONIA in the last 168 hours. Coagulation Profile: No results for input(s): INR, PROTIME in the last 168 hours. Cardiac Enzymes:  Recent Labs Lab 10/23/16 2303 10/24/16 0303  TROPONINI 0.05* 0.04*   BNP (last 3 results) No results for input(s): PROBNP in the last 8760 hours. HbA1C:  Recent Labs  10/24/16 0303  HGBA1C 5.2   CBG: No results for input(s): GLUCAP in the last 168 hours. Lipid Profile: No results for input(s): CHOL, HDL, LDLCALC, TRIG, CHOLHDL, LDLDIRECT in the last 72 hours. Thyroid Function Tests:  Recent Labs  10/24/16 0025  TSH 3.571  FREET4 0.74   Anemia Panel: No results for input(s): VITAMINB12, FOLATE, FERRITIN, TIBC, IRON, RETICCTPCT in the last 72 hours. Urine analysis: No results found for: COLORURINE, APPEARANCEUR, LABSPEC, PHURINE, GLUCOSEU, HGBUR, BILIRUBINUR, KETONESUR, PROTEINUR, UROBILINOGEN, NITRITE, LEUKOCYTESUR Sepsis  Labs: @LABRCNTIP (procalcitonin:4,lacticidven:4) )No results found for this or any previous visit (from the past 240 hour(s)).   Radiological Exams on Admission: Dg Chest 2 View  Result Date: 10/23/2016 CLINICAL DATA:  61 year old male with history of shortness of breath. Rapid heart rate. EXAM: CHEST  2 VIEW COMPARISON:  Chest x-ray 10/23/2016. FINDINGS: The heart size and mediastinal contours are within normal limits. Both lungs are clear. The visualized skeletal structures are unremarkable. IMPRESSION: No active cardiopulmonary disease. Electronically Signed   By: Vinnie Langton M.D.   On: 10/23/2016 20:39     EKG: Independently reviewed.  Sinus rhythm, QTC 422, nonspecific T-wave change, LAE.   Assessment/Plan Principal Problem:   Palpitation Active Problems:   Depression   GERD   Elevated troponin   SOB (shortness of breath)   Anxiety   Palpitation and possible PAF: Etiology for patient's palpitation is not clear. Patient states that EKG done in PCPs office showed  irregular heart beat and possible atrial fibrillation. CHA2DS2-VASc Score is 0, will not need oral anticoagulation. Given new onset of possible A fib and positive trop, will start iv heparin.   - will place on Tele bed for obs - start IV heparin - check TSH, Free T4  Elevated trop: trop 0.05. No CP. Likely due to demand ischemia secondary to heart racing. - cycle CE q6 x3 and repeat EKG in the am  - prn Nitroglycerin, and aspirin - Risk factor stratification: will check FLP, UDS and A1C  - 2d echo - Inpatient non-urgent consult order was put in Epic and message to [employer] was sent out.  GERD: -Protonix prn  Depression and anxiety: Stable, no suicidal or homicidal ideations. -Continue home medications: Remeron and temazepam  SOB: Likely secondary to heart racing. No chest pain. No oxygen desaturation, no signs of DVT, less likely to have PE. X-rays negative. -When necessary albuterol nebulizers   DVT  ppx: on IV Heparin  Code Status: Full code Family Communication: None at bed side.    Disposition Plan:  Anticipate discharge back to previous home environment Consults called:  none Admission status: Obs / tele    Date of Service 10/24/2016    Ivor Costa [facility] Pager [phone]  If 7PM-7AM, please contact night-coverage [email or web address] Password [email or web address] 10/24/2016, 4:44 AM

## 2016-10-24 NOTE — ED Provider Notes (Signed)
[facility] DEPT Provider Note   CSN: [id number] Arrival date & time: 10/23/16  6803     History   Chief Complaint Chief Complaint  Patient presents with  . Abnormal Lab  . Shortness of Breath  . Palpitations    HPI Calvin Conner is a 61 y.o. male.  The history is provided by the patient and medical records.    61 y.o. M with hx of depression, fibromyalgia, GERD, IBS, hemorrhoids, presenting to the ED for abnormal test results.  Patient reports this morning he experienced palpitations and rapid HR.  Reports this lasted for about 3 hours total before resolving spontaneously.  States he is not sure of anything that set it off or helped it resolved.  During this event he reports he was sweaty and felt lightheaded with SOB.  Never had any chest pain.  States he has had episodes in the past like this but never lasted this long.  He was evaluated by his PCP who drew some labs and called him this afternoon and told him to come to the ED with elevated troponin.  States he feels fine currently, still no CP.  No known cardiac history.  Does have family hx of AFIB.  No thyroid issues.  Not a smoker.  No hx of DVT or PE.  Past Medical History:  Diagnosis Date  . Depression   . Fibromyalgia   . GERD (gastroesophageal reflux disease)    w/ LA Grade A erosive esophagitis  . Hiatal hernia   . IBS (irritable bowel syndrome)   . Internal hemorrhoids     Patient Active Problem List   Diagnosis Date Noted  . HIATAL HERNIA 09/07/2008  . DEPRESSION 05/30/2007  . GERD 05/30/2007  . IRRITABLE BOWEL SYNDROME 05/30/2007  . FIBROMYALGIA 05/30/2007    Past Surgical History:  Procedure Laterality Date  . LAPAROSCOPIC NISSEN FUNDOPLICATION  21/2248       Home Medications    Prior to Admission medications   Medication Sig Start Date End Date Taking? Authorizing Provider  ALINIA 500 MG tablet TAKES 2 TABLETS BY MOUTH TWICE DAILY  11/10/10   [provider]  AMBULATORY NON  FORMULARY MEDICATION TESTOSTERONE INJ 0.7 ONCE A WEEK     [provider]  anastrozole (ARIMIDEX) 1 MG tablet Take 1 mg by mouth daily.      [provider]  methylphenidate (RITALIN) 20 MG tablet Take 20 mg by mouth daily.      [provider]  rifaximin (XIFAXAN) 550 MG TABS Take 1 tablet (550 mg total) by mouth 2 (two) times daily. 11/16/10   Ladene Artist, MD    Family History Family History  Problem Relation Age of Onset  . Breast cancer Mother   . Colon cancer Neg Hx     Social History Social History  Substance Use Topics  . Smoking status: Never Smoker  . Smokeless tobacco: Never Used  . Alcohol use No     Allergies   Penicillins   Review of Systems Review of Systems  Respiratory: Positive for shortness of breath.   Cardiovascular: Positive for palpitations.  All other systems reviewed and are negative.    Physical Exam Updated Vital Signs BP 114/81 (BP Location: Right Arm)   Pulse (!) 58   Temp 98.1 F (36.7 C) (Oral)   Resp 18   Ht 6\' 2"  (1.88 m)   Wt 90.7 kg (200 lb)   SpO2 96%   BMI 25.68 kg/m  Physical Exam  Constitutional: He is oriented to person, place, and time. He appears well-developed and well-nourished.  HENT:  Head: Normocephalic and atraumatic.  Mouth/Throat: Oropharynx is clear and moist.  Eyes: Pupils are equal, round, and reactive to light. Conjunctivae and EOM are normal.  Neck: Normal range of motion.  Cardiovascular: Normal rate, regular rhythm and normal heart sounds.   Pulmonary/Chest: Effort normal and breath sounds normal. No respiratory distress. He has no wheezes.  Abdominal: Soft. Bowel sounds are normal. There is no tenderness. There is no rebound.  Musculoskeletal: Normal range of motion.  Neurological: He is alert and oriented to person, place, and time.  Skin: Skin is warm and dry.  Psychiatric: He has a normal mood and affect.  Nursing note and vitals reviewed.    ED Treatments /  Results  Labs (all labs ordered are listed, but only abnormal results are displayed) Labs Reviewed  BASIC METABOLIC PANEL - Abnormal; Notable for the following:       Result Value   Sodium 134 (*)    Glucose, Bld 133 (*)    All other components within normal limits  TROPONIN I - Abnormal; Notable for the following:    Troponin I 0.05 (*)    All other components within normal limits  BRAIN NATRIURETIC PEPTIDE - Abnormal; Notable for the following:    B Natriuretic Peptide 110.1 (*)    All other components within normal limits  CBC  TSH  T4, FREE  I-STAT TROPONIN, ED    EKG  EKG Interpretation  Date/Time:  Tuesday October 23 2016 19:03:34 EDT Ventricular Rate:  69 PR Interval:  180 QRS Duration: 86 QT Interval:  394 QTC Calculation: 422 R Axis:   51 Text Interpretation:  Normal sinus rhythm Normal ECG Nonspecific ST abnormality - lateral leads No significant change since last tracing Confirmed by Varney Biles [phone]) on 10/23/2016 11:04:12 PM       Radiology Dg Chest 2 View  Result Date: 10/23/2016 CLINICAL DATA:  61 year old male with history of shortness of breath. Rapid heart rate. EXAM: CHEST  2 VIEW COMPARISON:  Chest x-ray 10/23/2016. FINDINGS: The heart size and mediastinal contours are within normal limits. Both lungs are clear. The visualized skeletal structures are unremarkable. IMPRESSION: No active cardiopulmonary disease. Electronically Signed   By: Vinnie Langton M.D.   On: 10/23/2016 20:39    Procedures Procedures (including critical care time)  CRITICAL CARE Performed by: Larene Pickett   Total critical care time: 35 minutes  Critical care time was exclusive of separately billable procedures and treating other patients.  Critical care was necessary to treat or prevent imminent or life-threatening deterioration.  Critical care was time spent personally by me on the following activities: development of treatment plan with patient and/or  surrogate as well as nursing, discussions with consultants, evaluation of patient's response to treatment, examination of patient, obtaining history from patient or surrogate, ordering and performing treatments and interventions, ordering and review of laboratory studies, ordering and review of radiographic studies, pulse oximetry and re-evaluation of patient's condition.   Medications Ordered in ED Medications - No data to display   Initial Impression / Assessment and Plan / ED Course  I have reviewed the triage vital signs and the nursing notes.  Pertinent labs & imaging results that were available during my care of the patient were reviewed by me and considered in my medical decision making (see chart for details).  61 year old male here after a three-hour period of  palpitations, shortness of breath, and lightheadedness.  Reports this resolved spontaneously. Was evaluated by his PCP this morning and told to come to the ED with elevated troponin. He has never had any chest pain. Currently asymptomatic. EKG with nonspecific ST changes. Troponin is elevated. 0.05. Remainder of labs overall reassuring. Chest x-ray is clear. Patient's symptoms are concerning for possible A. fib. Reports he has had episodes like this in the past, but never lasted this long so never had them evaluated. There is family history of A. fib. Will give dose of aspirin, send thyroid studies. Plan for admission to hospitalist service to trend troponin, tele monitoring.  Discussed with Dr. Blaine Hamper-- will admit for ongoing care.  Final Clinical Impressions(s) / ED Diagnoses   Final diagnoses:  Elevated troponin    New Prescriptions New Prescriptions   No medications on file     Larene Pickett, PA-C 10/24/16 0206    Varney Biles, MD 10/24/16 [phone]

## 2016-10-24 NOTE — Progress Notes (Signed)
[place] for heparin Indication: new afib, elevated troponin   Allergies  Allergen Reactions  . Penicillins Other (See Comments)    Childhood allergy     Patient Measurements: Height: 6\' 2"  (188 cm) Weight: 200 lb (90.7 kg) IBW/kg (Calculated) : 82.2 Heparin Dosing Weight: 90.7 kg   Vital Signs: BP: 120/83 (10/03 1130) Pulse Rate: 66 (10/03 1130)  Labs:  Recent Labs  10/23/16 1924 10/23/16 2303 10/24/16 0303 10/24/16 0914 10/24/16 1000  HGB 13.9  --   --   --   --   HCT 40.0  --   --   --   --   PLT 200  --   --   --   --   HEPARINUNFRC  --   --   --   --  0.56  CREATININE 1.04  --   --   --   --   TROPONINI  --  0.05* 0.04* 0.03*  --     Estimated Creatinine Clearance: 84.5 mL/min (by C-G formula based on SCr of 1.04 mg/dL).  Assessment: 61 yo male admitted with SOB and elevated troponin. Pharmacy consulted to dose heparin for possible new onset afib and elevated troponin. No oral anticoagulation PTA per medication list.    Initial heparin level is therapeutic at 0.56. No bleeding noted.   Goal of Therapy:  Heparin level 0.3-0.7 units/ml Monitor platelets by anticoagulation protocol: Yes   Plan:  Continue heparin gtt 1300 units/hr Daily heparin level and CBC  Salome Arnt, PharmD, BCPS Phone #: [phone] from 7am to 3:30pm All other times, call [facility] x [phone] 10/24/2016 12:44 PM

## 2016-10-24 NOTE — ED Notes (Signed)
Report given to [facility]  in ED. Pt being moved to another unit for holding until tele bed is available.

## 2016-10-24 NOTE — ED Notes (Signed)
cardiology PA at bedside

## 2016-10-24 NOTE — Discharge Summary (Signed)
Physician Discharge Summary  HERBERTH DEHARO OHY:[id number] DOB: Oct 24, 1955 DOA: 10/23/2016  PCP: Lollie Sails, MD  Admit date: 10/23/2016 Discharge date: 10/24/2016  Recommendations for Outpatient Follow-up:  1. Follow-up palpitations, plan as below   Follow-up Information    Lollie Sails, MD Follow up.   Specialty:  Internal Medicine Why:  as needed Contact information: [address] [place] [address] [phone]        Pixie Casino, MD Follow up.   Specialty:  Cardiology Why:  office will call you with appointment Contact information: [place] [address] [phone]        Evans Lance, MD Follow up.   Specialty:  Cardiology Why:  Friday 10/5 at 6:30 AM at [facility] information: [address] [place] [address] [phone]            Discharge Diagnoses:  1. Palpitations  Discharge Condition: elevated troponin Disposition: home  Diet recommendation: heart healthy  Filed Weights   10/23/16 1902  Weight: 90.7 kg (200 lb)    History of present illness:  61 year old woman PMH GERD presented with palpitations and shortness of breath lasting for about 3 hours. No chest pain. Seen by PCP, found to have a positive troponin and therefore sent to the emergency department. EKG in the outpatient setting reported to show irregular heartbeat with possible A. Fib. Admitted for palpitations and possible paroxysmal atrial fibrillation.  Hospital Course:  Observed. No further palpitations. Troponins flat, seen by cardiology who recommended implanted loop recorder in outpatient follow-up as below. Hospitalization was uncomplicated.  Palpitations, possible paroxysmal atrial fibrillation. -CHA2DS2-VASc 0 -cardiology recommends implantable loop recorder and will arrange for outpatient stress testing and echocardiogram with plans to follow-up with Dr. Debara Pickett as an outpatient -no  changes to medications at this point.  Elevated troponin secondary to presumed rapid heart rate.   Today's assessment: S:feeling fine. No palpitations O: Vitals: afebrile, 98.1, 17, 56, 113/77   Constitutional: appears calm, comfortable.  Cardiovascular. Regular rate and rhythm. No murmur, rub or gallop.  Respiratory. Clear to auscultation bilaterally. No wheezes, rales or rhonchi. Normal respiratory effort.  Psychiatric. Grossly normal mood and affect. Speech fluent and appropriate.   Basic metabolic panel unremarkable  Troponins flat 0.05, 0.04, 0.03  CBC unremarkable  TSH within normal limits  Hemoglobin A1c within normal limits  Urine drug screen positive for benzodiazepines  Chest x-ray and apparently reviewed, no acute disease  EKG sinus rhythm, no specific ST changes  Discharge Instructions  Discharge Instructions    Activity as tolerated - No restrictions    Complete by:  As directed    Diet - low sodium heart healthy    Complete by:  As directed    Discharge instructions    Complete by:  As directed    Call your physician or seek immediate medical attention for palpitations, chest pain, shortness of breath or worsening of condition.     Allergies as of 10/24/2016      Reactions   Penicillins Other (See Comments)   Childhood allergy       Medication List    TAKE these medications   MAGNESIUM PO Take 1 tablet by mouth every morning.   mirtazapine 30 MG tablet Commonly known as:  REMERON Take 30 mg by mouth at bedtime.   temazepam 15 MG capsule Commonly known as:  RESTORIL Take 15 mg by mouth at bedtime as needed for sleep.   zolpidem  10 MG tablet Commonly known as:  AMBIEN Take 10 mg by mouth at bedtime as needed for sleep.      Allergies  Allergen Reactions  . Penicillins Other (See Comments)    Childhood allergy     The results of significant diagnostics from this hospitalization (including imaging, microbiology, ancillary and  laboratory) are listed below for reference.    Significant Diagnostic Studies: Dg Chest 2 View  Result Date: 10/23/2016 CLINICAL DATA:  61 year old male with history of shortness of breath. Rapid heart rate. EXAM: CHEST  2 VIEW COMPARISON:  Chest x-ray 10/23/2016. FINDINGS: The heart size and mediastinal contours are within normal limits. Both lungs are clear. The visualized skeletal structures are unremarkable. IMPRESSION: No active cardiopulmonary disease. Electronically Signed   By: Vinnie Langton M.D.   On: 10/23/2016 20:39    Labs: Basic Metabolic Panel:  Recent Labs Lab 10/23/16 1924  NA 134*  K 3.8  CL 107  CO2 22  GLUCOSE 133*  BUN 18  CREATININE 1.04  CALCIUM 9.0   CBC:  Recent Labs Lab 10/23/16 1924  WBC 5.3  HGB 13.9  HCT 40.0  MCV 89.9  PLT 200   Cardiac Enzymes:  Recent Labs Lab 10/23/16 2303 10/24/16 0303 10/24/16 0914  TROPONINI 0.05* 0.04* 0.03*     Recent Labs  10/23/16 2303  BNP 110.1*    Principal Problem:   Palpitation Active Problems:   Depression   GERD   Elevated troponin   SOB (shortness of breath)   Anxiety   Time coordinating discharge: 35 minutes  Signed:  Murray Hodgkins, MD [facility] 10/24/2016, 4:01 PM

## 2016-10-25 ENCOUNTER — Telehealth: Payer: Self-pay | Admitting: Internal Medicine

## 2016-10-25 DIAGNOSIS — R0602 Shortness of breath: Secondary | ICD-10-CM

## 2016-10-25 DIAGNOSIS — R778 Other specified abnormalities of plasma proteins: Secondary | ICD-10-CM

## 2016-10-25 DIAGNOSIS — R002 Palpitations: Secondary | ICD-10-CM

## 2016-10-25 DIAGNOSIS — R7989 Other specified abnormal findings of blood chemistry: Principal | ICD-10-CM

## 2016-10-25 DIAGNOSIS — R9431 Abnormal electrocardiogram [ECG] [EKG]: Secondary | ICD-10-CM

## 2016-10-25 NOTE — Telephone Encounter (Signed)
New message    Pt was in ER yesterday and was told to schedule out pt stress and echo , need orders put in thank you

## 2016-10-25 NOTE — Telephone Encounter (Signed)
Exercise myoview and echo .Marland Kitchen He is having a loop recorder on Friday.   DR. Lemmie Evens

## 2016-10-25 NOTE — Telephone Encounter (Signed)
Patient seen in ED yesterday. Per MD note, patient should have outpatient stress test & echo. Will route to MD to determine what type of stress test - diagnoses.

## 2016-10-26 ENCOUNTER — Ambulatory Visit (HOSPITAL_COMMUNITY)
Admission: RE | Admit: 2016-10-26 | Discharge: 2016-10-26 | Disposition: A | Discharge status: Home / Self Care | Payer: BLUE CROSS/BLUE SHIELD | Source: Ambulatory Visit | Attending: Internal Medicine | Admitting: Internal Medicine

## 2016-10-26 ENCOUNTER — Encounter (HOSPITAL_COMMUNITY)
Admission: RE | Disposition: A | Discharge status: Home / Self Care | Payer: Self-pay | Source: Ambulatory Visit | Attending: Internal Medicine

## 2016-10-26 ENCOUNTER — Encounter (HOSPITAL_COMMUNITY): Payer: Self-pay | Admitting: Internal Medicine

## 2016-10-26 DIAGNOSIS — M797 Fibromyalgia: Secondary | ICD-10-CM | POA: Diagnosis not present

## 2016-10-26 DIAGNOSIS — K589 Irritable bowel syndrome without diarrhea: Secondary | ICD-10-CM | POA: Insufficient documentation

## 2016-10-26 DIAGNOSIS — R002 Palpitations: Secondary | ICD-10-CM | POA: Insufficient documentation

## 2016-10-26 DIAGNOSIS — Z8249 Family history of ischemic heart disease and other diseases of the circulatory system: Secondary | ICD-10-CM | POA: Diagnosis not present

## 2016-10-26 DIAGNOSIS — K219 Gastro-esophageal reflux disease without esophagitis: Secondary | ICD-10-CM | POA: Diagnosis not present

## 2016-10-26 DIAGNOSIS — F329 Major depressive disorder, single episode, unspecified: Secondary | ICD-10-CM | POA: Insufficient documentation

## 2016-10-26 DIAGNOSIS — Z880 Allergy status to penicillin: Secondary | ICD-10-CM | POA: Insufficient documentation

## 2016-10-26 HISTORY — PX: LOOP RECORDER INSERTION: EP1214

## 2016-10-26 SURGERY — LOOP RECORDER INSERTION

## 2016-10-26 MED ORDER — LIDOCAINE-EPINEPHRINE 1 %-1:100000 IJ SOLN
INTRAMUSCULAR | Status: AC
  Filled 2016-10-26: qty 1

## 2016-10-26 MED ORDER — LIDOCAINE-EPINEPHRINE 1 %-1:100000 IJ SOLN
INTRAMUSCULAR | Status: DC | PRN
  Administered 2016-10-26: 20 mL

## 2016-10-26 SURGICAL SUPPLY — 2 items
LOOP REVEAL LINQSYS (Prosthesis & Implant Heart) ×3 IMPLANT
PACK LOOP INSERTION (CUSTOM PROCEDURE TRAY) ×3 IMPLANT

## 2016-10-26 NOTE — Telephone Encounter (Signed)
Echo & exercise myoview ordered. Staff message to [employer] to schedule

## 2016-10-26 NOTE — Consult Note (Signed)
ELECTROPHYSIOLOGY CONSULT NOTE    Patient ID: Calvin Conner MRN: [id number], DOB/AGE: 1955/04/12 61 y.o.  Admit date: 10/26/2016 Date of Consult: 10/26/2016  Primary Physician: Lollie Sails, MD Primary Cardiologist: [place] Electrophysiologist: Lovena Le  Patient Profile: Calvin Conner is a 61 y.o. male with a history of GERD, IBS, and fibromyalgia who is being seen today for the evaluation of palpitations at the request of Dr Debara Pickett.  HPI:  Calvin Conner is a 61 y.o. male who presented to the ER earlier this week with symptomatic palpitations. He has had palpitations that have been intermittent for several years and usually occur every 9-12 months. He is unaware for triggers or relieving factors. With palpitations, he has some shortness of breath with exertion.  His episodes typically last for 5-10 minutes, however, the episode this week lasted for 3 hours and he came to the ER for further evaluation.  He has a family history of atrial fibrillation. He is active at home without functional limitations.   He is seeing Dr Debara Pickett soon with plans for echo and GXT.   He denies chest pain, palpitations, dyspnea, PND, orthopnea, nausea, vomiting, dizziness, syncope, edema, weight gain, or early satiety.  Past Medical History:  Diagnosis Date  . Depression   . Fibromyalgia   . GERD (gastroesophageal reflux disease)    w/ LA Grade A erosive esophagitis  . Hiatal hernia   . IBS (irritable bowel syndrome)   . Internal hemorrhoids      Surgical History:  Past Surgical History:  Procedure Laterality Date  . LAPAROSCOPIC NISSEN FUNDOPLICATION  59/5396     Prescriptions Prior to Admission  Medication Sig Dispense Refill Last Dose  . MAGNESIUM PO Take 1 tablet by mouth every morning.   Past Week at Unknown time  . mirtazapine (REMERON) 30 MG tablet Take 30 mg by mouth at bedtime.   10/22/2016  . temazepam (RESTORIL) 15 MG capsule Take 15 mg by mouth at bedtime as needed for sleep.    10/22/2016  . zolpidem (AMBIEN) 10 MG tablet Take 10 mg by mouth at bedtime as needed for sleep.   10/22/2016    Inpatient Medications:   Allergies:  Allergies  Allergen Reactions  . Penicillins Other (See Comments)    Childhood allergy     Social History   Social History  . Marital status: Married    Spouse name: N/A  . Number of children: 2  . Years of education: N/A   Occupational History  . Not on file.   Social History Main Topics  . Smoking status: Never Smoker  . Smokeless tobacco: Never Used  . Alcohol use No  . Drug use: No  . Sexual activity: Not on file   Other Topics Concern  . Not on file   Social History Narrative  . No narrative on file     Family History  Problem Relation Age of Onset  . Breast cancer Mother   . Colon cancer Neg Hx      Review of Systems: All other systems reviewed and are otherwise negative except as noted above.  Physical Exam: Vitals:   10/26/16 0619  BP: (P) 122/71  Pulse: (P) 75  Resp: (P) 18  Temp: (P) 98.3 F (36.8 C)  TempSrc: (P) Oral  SpO2: (P) 97%  Weight: (P) 200 lb (90.7 kg)  Height: (P) 6\' 2"  (1.88 m)    GEN- The patient is well appearing, alert and oriented x 3 today.   HEENT:  normocephalic, atraumatic; sclera clear, conjunctiva pink; hearing intact; oropharynx clear; neck supple Lungs- Clear to ausculation bilaterally, normal work of breathing.  No wheezes, rales, rhonchi Heart- Regular rate and rhythm  GI- soft, non-tender, non-distended, bowel sounds present Extremities- no clubbing, cyanosis, or edema  MS- no significant deformity or atrophy Skin- warm and dry, no rash or lesion Psych- euthymic mood, full affect Neuro- strength and sensation are intact  Labs:  Lab Results  Component Value Date   WBC 5.3 10/23/2016   HGB 13.9 10/23/2016   HCT 40.0 10/23/2016   MCV 89.9 10/23/2016   PLT 200 10/23/2016    Recent Labs Lab 10/23/16 1924  NA 134*  K 3.8  CL 107  CO2 22  BUN 18    CREATININE 1.04  CALCIUM 9.0  GLUCOSE 133*      Radiology/Studies: Dg Chest 2 View  Result Date: 10/23/2016 CLINICAL DATA:  61 year old male with history of shortness of breath. Rapid heart rate. EXAM: CHEST  2 VIEW COMPARISON:  Chest x-ray 10/23/2016. FINDINGS: The heart size and mediastinal contours are within normal limits. Both lungs are clear. The visualized skeletal structures are unremarkable. IMPRESSION: No active cardiopulmonary disease. Electronically Signed   By: Vinnie Langton M.D.   On: 10/23/2016 20:39    ZTI:WPYKD rhythm, rate 69 (personally reviewed)  TELEMETRY: sinus rhythm (from ER visit earlier this week) (personally reviewed)  Assessment/Plan: 1.  Palpitations The patient has symptomatic palpitations that are intermittent occurring every 9-12 months. These are associated with shortness of breath with exertion.  He has a family history of atrial fibrillation.  Because of intermittent nature of palpitations, there is no benefit from short term monitoring. Risks, benefits to ILR implant reviewed with the patient who wishes to proceed.    Signed, Chanetta Marshall 10/26/2016 6:40 AM  EP Attending  Patient seen and examined. Agree with above. The patient presents today for insertion of an ILR for monitoring of palpitations with a h/o atrial fib. I have reviewed the indications with the patient and he wishes to proceed.  Mikle Bosworth.D.

## 2016-10-26 NOTE — H&P (View-Only) (Signed)
ELECTROPHYSIOLOGY CONSULT NOTE    Patient ID: Calvin Conner MRN: [id number], DOB/AGE: Jul 09, 1955 61 y.o.  Admit date: 10/26/2016 Date of Consult: 10/26/2016  Primary Physician: Lollie Sails, MD Primary Cardiologist: [place] Electrophysiologist: Lovena Le  Patient Profile: Calvin Conner is a 61 y.o. male with a history of GERD, IBS, and fibromyalgia who is being seen today for the evaluation of palpitations at the request of Dr Debara Pickett.  HPI:  Calvin Conner is a 61 y.o. male who presented to the ER earlier this week with symptomatic palpitations. He has had palpitations that have been intermittent for several years and usually occur every 9-12 months. He is unaware for triggers or relieving factors. With palpitations, he has some shortness of breath with exertion.  His episodes typically last for 5-10 minutes, however, the episode this week lasted for 3 hours and he came to the ER for further evaluation.  He has a family history of atrial fibrillation. He is active at home without functional limitations.   He is seeing Dr Debara Pickett soon with plans for echo and GXT.   He denies chest pain, palpitations, dyspnea, PND, orthopnea, nausea, vomiting, dizziness, syncope, edema, weight gain, or early satiety.  Past Medical History:  Diagnosis Date  . Depression   . Fibromyalgia   . GERD (gastroesophageal reflux disease)    w/ Calvin Grade A erosive esophagitis  . Hiatal hernia   . IBS (irritable bowel syndrome)   . Internal hemorrhoids      Surgical History:  Past Surgical History:  Procedure Laterality Date  . LAPAROSCOPIC NISSEN FUNDOPLICATION  82/9937     Prescriptions Prior to Admission  Medication Sig Dispense Refill Last Dose  . MAGNESIUM PO Take 1 tablet by mouth every morning.   Past Week at Unknown time  . mirtazapine (REMERON) 30 MG tablet Take 30 mg by mouth at bedtime.   10/22/2016  . temazepam (RESTORIL) 15 MG capsule Take 15 mg by mouth at bedtime as needed for sleep.    10/22/2016  . zolpidem (AMBIEN) 10 MG tablet Take 10 mg by mouth at bedtime as needed for sleep.   10/22/2016    Inpatient Medications:   Allergies:  Allergies  Allergen Reactions  . Penicillins Other (See Comments)    Childhood allergy     Social History   Social History  . Marital status: Married    Spouse name: N/A  . Number of children: 2  . Years of education: N/A   Occupational History  . Not on file.   Social History Main Topics  . Smoking status: Never Smoker  . Smokeless tobacco: Never Used  . Alcohol use No  . Drug use: No  . Sexual activity: Not on file   Other Topics Concern  . Not on file   Social History Narrative  . No narrative on file     Family History  Problem Relation Age of Onset  . Breast cancer Mother   . Colon cancer Neg Hx      Review of Systems: All other systems reviewed and are otherwise negative except as noted above.  Physical Exam: Vitals:   10/26/16 0619  BP: (P) 122/71  Pulse: (P) 75  Resp: (P) 18  Temp: (P) 98.3 F (36.8 C)  TempSrc: (P) Oral  SpO2: (P) 97%  Weight: (P) 200 lb (90.7 kg)  Height: (P) 6\' 2"  (1.88 m)    GEN- The patient is well appearing, alert and oriented x 3 today.   HEENT:  normocephalic, atraumatic; sclera clear, conjunctiva pink; hearing intact; oropharynx clear; neck supple Lungs- Clear to ausculation bilaterally, normal work of breathing.  No wheezes, rales, rhonchi Heart- Regular rate and rhythm  GI- soft, non-tender, non-distended, bowel sounds present Extremities- no clubbing, cyanosis, or edema  MS- no significant deformity or atrophy Skin- warm and dry, no rash or lesion Psych- euthymic mood, full affect Neuro- strength and sensation are intact  Labs:  Lab Results  Component Value Date   WBC 5.3 10/23/2016   HGB 13.9 10/23/2016   HCT 40.0 10/23/2016   MCV 89.9 10/23/2016   PLT 200 10/23/2016    Recent Labs Lab 10/23/16 1924  NA 134*  K 3.8  CL 107  CO2 22  BUN 18    CREATININE 1.04  CALCIUM 9.0  GLUCOSE 133*      Radiology/Studies: Dg Chest 2 View  Result Date: 10/23/2016 CLINICAL DATA:  61 year old male with history of shortness of breath. Rapid heart rate. EXAM: CHEST  2 VIEW COMPARISON:  Chest x-ray 10/23/2016. FINDINGS: The heart size and mediastinal contours are within normal limits. Both lungs are clear. The visualized skeletal structures are unremarkable. IMPRESSION: No active cardiopulmonary disease. Electronically Signed   By: Vinnie Langton M.D.   On: 10/23/2016 20:39    UJW:JXBJY rhythm, rate 69 (personally reviewed)  TELEMETRY: sinus rhythm (from ER visit earlier this week) (personally reviewed)  Assessment/Plan: 1.  Palpitations The patient has symptomatic palpitations that are intermittent occurring every 9-12 months. These are associated with shortness of breath with exertion.  He has a family history of atrial fibrillation.  Because of intermittent nature of palpitations, there is no benefit from short term monitoring. Risks, benefits to ILR implant reviewed with the patient who wishes to proceed.    Signed, Chanetta Marshall 10/26/2016 6:40 AM  EP Attending  Patient seen and examined. Agree with above. The patient presents today for insertion of an ILR for monitoring of palpitations with a h/o atrial fib. I have reviewed the indications with the patient and he wishes to proceed.  Mikle Bosworth.D.

## 2016-10-26 NOTE — Telephone Encounter (Signed)
Patient scheduled for echo & myoview on 11/06/16

## 2016-10-26 NOTE — Interval H&P Note (Signed)
History and Physical Interval Note:  10/26/2016 7:37 AM  Calvin Conner  has presented today for surgery, with the diagnosis of palpitations  The various methods of treatment have been discussed with the patient and family. After consideration of risks, benefits and other options for treatment, the patient has consented to  Procedure(s): LOOP RECORDER INSERTION (N/A) as a surgical intervention .  The patient's history has been reviewed, patient examined, no change in status, stable for surgery.  I have reviewed the patient's chart and labs.  Questions were answered to the patient's satisfaction.     Cristopher Peru

## 2016-11-01 ENCOUNTER — Telehealth (HOSPITAL_COMMUNITY): Payer: Self-pay | Admitting: *Deleted

## 2016-11-01 ENCOUNTER — Telehealth (HOSPITAL_COMMUNITY): Payer: Self-pay

## 2016-11-01 ENCOUNTER — Telehealth (HOSPITAL_COMMUNITY): Payer: Self-pay | Admitting: Internal Medicine

## 2016-11-01 NOTE — Telephone Encounter (Signed)
Patient given detailed instructions per Myocardial Perfusion Study Information Sheet for the test on 11/06/16. Patient notified to arrive 15 minutes early and that it is imperative to arrive on time for appointment to keep from having the test rescheduled.  If you need to cancel or reschedule your appointment, please call the office within 24 hours of your appointment. . Patient verbalized understanding. Kirstie Peri

## 2016-11-01 NOTE — Telephone Encounter (Signed)
Encounter complete. 

## 2016-11-01 NOTE — Telephone Encounter (Signed)
User: Cherie Dark A Date/time: 11/01/16 10:40 AM  Comment: Called pt and lmsg for her to CB to change appt times for test on 10/16.   Context:  Outcome: Left Message  Phone number: [phone] Phone Type: Home Phone  Comm. type: Telephone Call type: Outgoing  Contact: Murray Hodgkins A Relation to patient: Self

## 2016-11-06 ENCOUNTER — Ambulatory Visit (HOSPITAL_COMMUNITY): Payer: BLUE CROSS/BLUE SHIELD | Attending: Cardiovascular Disease

## 2016-11-06 ENCOUNTER — Inpatient Hospital Stay (HOSPITAL_COMMUNITY): Admit: 2016-11-06 | Payer: BLUE CROSS/BLUE SHIELD

## 2016-11-06 ENCOUNTER — Other Ambulatory Visit: Payer: Self-pay

## 2016-11-06 ENCOUNTER — Ambulatory Visit (HOSPITAL_BASED_OUTPATIENT_CLINIC_OR_DEPARTMENT_OTHER): Payer: BLUE CROSS/BLUE SHIELD

## 2016-11-06 DIAGNOSIS — R7989 Other specified abnormal findings of blood chemistry: Principal | ICD-10-CM

## 2016-11-06 DIAGNOSIS — R0602 Shortness of breath: Secondary | ICD-10-CM | POA: Insufficient documentation

## 2016-11-06 DIAGNOSIS — R002 Palpitations: Secondary | ICD-10-CM | POA: Diagnosis not present

## 2016-11-06 DIAGNOSIS — R748 Abnormal levels of other serum enzymes: Secondary | ICD-10-CM

## 2016-11-06 DIAGNOSIS — R778 Other specified abnormalities of plasma proteins: Secondary | ICD-10-CM

## 2016-11-06 LAB — MYOCARDIAL PERFUSION IMAGING
CHL CUP MPHR: 157 {beats}/min
CHL CUP NUCLEAR SRS: 2
CHL CUP NUCLEAR SSS: 2
CSEPEW: 11.7 METS
CSEPPHR: 148 {beats}/min
Exercise duration (min): 10 min
LHR: 0.26
LV dias vol: 133 mL (ref 62–150)
LVSYSVOL: 67 mL
NUC STRESS TID: 0.95
Percent HR: 94 %
RPE: 18
Rest HR: 55 {beats}/min
SDS: 0

## 2016-11-06 LAB — ECHOCARDIOGRAM COMPLETE
Height: 74 in
Weight: 3200 oz

## 2016-11-06 MED ORDER — TECHNETIUM TC 99M TETROFOSMIN IV KIT
30.3000 | PACK | Freq: Once | INTRAVENOUS | Status: AC | PRN
  Administered 2016-11-06: 30.3 via INTRAVENOUS
  Filled 2016-11-06: qty 31

## 2016-11-06 MED ORDER — TECHNETIUM TC 99M TETROFOSMIN IV KIT
10.1000 | PACK | Freq: Once | INTRAVENOUS | Status: AC | PRN
  Administered 2016-11-06: 10.1 via INTRAVENOUS
  Filled 2016-11-06: qty 11

## 2016-11-07 ENCOUNTER — Ambulatory Visit (INDEPENDENT_AMBULATORY_CARE_PROVIDER_SITE_OTHER): Payer: Self-pay | Admitting: *Deleted

## 2016-11-07 DIAGNOSIS — R002 Palpitations: Secondary | ICD-10-CM

## 2016-11-07 LAB — CUP PACEART INCLINIC DEVICE CHECK
Date Time Interrogation Session: 20181017135023
MDC IDC PG IMPLANT DT: 20181005

## 2016-11-07 NOTE — Progress Notes (Signed)
Wound check in clinic s/p ILR implant. Steri strips removed. Wound well healed without redness or edema. Incision edges approximated. Normal ILR device function. Battery status: Good. R-waves 0.54mV. (1) symptom episode recorded post implant- NSR; demonstration, 0 tachy episodes, 0 pause episodes, 0 brady episodes. 0 AF episodes (0% burden). Monthly summary reports and ROV with GT in 3 months.

## 2016-11-22 ENCOUNTER — Encounter: Payer: Self-pay | Admitting: Internal Medicine

## 2016-11-22 ENCOUNTER — Ambulatory Visit (INDEPENDENT_AMBULATORY_CARE_PROVIDER_SITE_OTHER): Payer: BLUE CROSS/BLUE SHIELD | Admitting: Internal Medicine

## 2016-11-22 VITALS — BP 104/66 | HR 61 | Ht 74.0 in | Wt 205.0 lb

## 2016-11-22 DIAGNOSIS — R7989 Other specified abnormal findings of blood chemistry: Secondary | ICD-10-CM

## 2016-11-22 DIAGNOSIS — R748 Abnormal levels of other serum enzymes: Secondary | ICD-10-CM | POA: Diagnosis not present

## 2016-11-22 DIAGNOSIS — R778 Other specified abnormalities of plasma proteins: Secondary | ICD-10-CM

## 2016-11-22 DIAGNOSIS — R002 Palpitations: Secondary | ICD-10-CM

## 2016-11-22 DIAGNOSIS — R0602 Shortness of breath: Secondary | ICD-10-CM

## 2016-11-22 MED ORDER — METOPROLOL TARTRATE 25 MG PO TABS: ORAL_TABLET | ORAL | 0 refills | Status: DC

## 2016-11-22 NOTE — Progress Notes (Signed)
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 He has had episodes sporadically every 6-9 months for the past few years. He reports his father and grandfather are known to have a-fib. His brother has "labile" hypertension. He denied any chest pain, but was dyspneic with exertion during the event. He tried to feel his pulse, but it was "too fast" and "weak". He denies presyncope or syncope or CHF symptoms. EKG and telemetry reviewed here which shows NSR. Echo pending. Troponin was mildly elevated and flat, consistent with demand ischemia, however, may be suggestive of underlying CAD. There is a suggestion of left atrial enlargement on the EKG. On exam he is frustrated but in NAD, lungs clear, RRR, no murmur, extremities without edema, A&Ox3.  After seeing him in the emergency department and ruling out for acute coronary syndrome, I recommended outpatient stress testing and an echocardiogram.  In addition we referred him to EP for placement of an implanted loop recorder.  This was placed by Dr. Crissie Conner and has been interrogated, but has not yet demonstrated any atrial fibrillation.  His stress test was negative for ischemia and his echo showed normal systolic function with moderate diastolic dysfunction.  He remains asymptomatic.  He denies any recurrent palpitations or tachycardia since we saw him in the hospital.  PMHx:  Past Medical History:  Diagnosis Date  . Depression   . Fibromyalgia   . GERD (gastroesophageal reflux disease)    w/ LA Grade A erosive esophagitis  . Hiatal hernia   . IBS (irritable bowel syndrome)   . Internal hemorrhoids     Past Surgical History:  Procedure Laterality Date  . LAPAROSCOPIC NISSEN FUNDOPLICATION  70/2637  . LOOP RECORDER INSERTION N/A 10/26/2016   Procedure: LOOP RECORDER INSERTION;  Surgeon: Calvin Lance, MD;  Location: [facility];  Service: Cardiovascular;  Laterality: N/A;    FAMHx:  Family History  Problem Relation Age of Onset  . Breast cancer Mother   . Colon cancer Neg Hx     SOCHx:   reports that he has never smoked. He has never used smokeless tobacco. He reports that he does not drink alcohol or use drugs.  ALLERGIES:  Allergies  Allergen Reactions  . Penicillins Other (See Comments)    Childhood allergy     ROS: Pertinent items noted in HPI and remainder of comprehensive ROS otherwise negative.  HOME MEDS: Current Outpatient Prescriptions on File Prior to Visit  Medication Sig Dispense Refill  . MAGNESIUM PO Take 1 tablet by mouth every morning.    . mirtazapine (REMERON) 30 MG tablet Take 30 mg by mouth at bedtime.    . temazepam (RESTORIL) 15 MG capsule Take 15 mg by mouth at bedtime as needed for sleep.    Marland Kitchen zolpidem (AMBIEN) 10 MG tablet Take 10 mg by mouth at bedtime as needed for sleep.     No current facility-administered medications on file prior to visit.     LABS/IMAGING: No results found for this or any previous visit (from the past 48 hour(s)). No results found.  LIPID PANEL:    Component Value Date/Time   CHOL 174 10/24/2016 0259   TRIG 32 10/24/2016 0259   HDL 54 10/24/2016 0259   CHOLHDL 3.2 10/24/2016 0259   VLDL 6 10/24/2016 0259  LDLCALC 114 (H) 10/24/2016 0259     WEIGHTS: Wt Readings from Last 3 Encounters:  11/22/16 205 lb (93 kg)  11/06/16 200 lb (90.7 kg)  10/26/16 (P) 200 lb (90.7 kg)    VITALS: BP 104/66   Pulse 61   Ht 6\' 2"  (1.88 m)   Wt 205 lb (93 kg)   BMI 26.32 kg/m   EXAM: General appearance: alert and no distress Neck: no carotid bruit, no JVD and thyroid not enlarged, symmetric, no tenderness/mass/nodules Lungs: clear to auscultation bilaterally Heart: regular rate and rhythm Abdomen: soft, non-tender; bowel sounds normal; no masses,   no organomegaly Extremities: extremities normal, atraumatic, no cyanosis or edema Pulses: 2+ and symmetric Skin: Skin color, texture, turgor normal. No rashes or lesions Neurologic: Grossly normal : Pleasant  EKG: Deferred  ASSESSMENT: 1. Paroxysmal tachycardia status post ILR 2. Normal exercise Myoview 3. Moderate diastolic dysfunction by echo  PLAN: 1.   Calvin Conner is feeling well denies any recurrent tachycardia.  He says he is interested in a prescription for a beta-blocker just in case he has any palpitations.  We will prescribe metoprolol tartrate.  He wishes only to follow-up with one cardiologist and therefore I would advise he follow up with Calvin Conner who can monitor his loop recorder.  If he should be found to have atrial fibrillation then I will defer to Calvin Conner for anticoagulation.  Follow-up with me as needed.  Calvin Casino, MD, [place]  Attending Cardiologist  Direct Dial: [phone]  Fax: [phone]  Website:  [email or web address] 11/22/2016, 5:55 PM

## 2016-11-22 NOTE — Patient Instructions (Signed)
Dr. Debara Pickett has prescribed metoprolol tartrate 25mg  to use AS NEEDED for palpitations  Your physician recommends that you schedule a follow-up appointment with Dr. Debara Pickett as needed.

## 2016-11-26 ENCOUNTER — Ambulatory Visit (INDEPENDENT_AMBULATORY_CARE_PROVIDER_SITE_OTHER): Payer: BLUE CROSS/BLUE SHIELD | Admitting: *Deleted

## 2016-11-26 DIAGNOSIS — R002 Palpitations: Secondary | ICD-10-CM | POA: Diagnosis not present

## 2016-11-26 NOTE — Progress Notes (Signed)
Carelink Summary Report / Loop Recorder 

## 2016-11-27 LAB — CUP PACEART REMOTE DEVICE CHECK
Date Time Interrogation Session: 20181104164100
MDC IDC PG IMPLANT DT: 20181005

## 2016-12-25 ENCOUNTER — Ambulatory Visit (INDEPENDENT_AMBULATORY_CARE_PROVIDER_SITE_OTHER): Payer: BLUE CROSS/BLUE SHIELD | Admitting: *Deleted

## 2016-12-25 DIAGNOSIS — R002 Palpitations: Secondary | ICD-10-CM

## 2016-12-26 NOTE — Progress Notes (Signed)
Carelink Summary Report / Loop Recorder 

## 2017-01-04 LAB — CUP PACEART REMOTE DEVICE CHECK
Date Time Interrogation Session: 20181204170952
Implantable Pulse Generator Implant Date: 20181005

## 2017-01-24 ENCOUNTER — Ambulatory Visit (INDEPENDENT_AMBULATORY_CARE_PROVIDER_SITE_OTHER): Payer: BLUE CROSS/BLUE SHIELD | Admitting: *Deleted

## 2017-01-24 DIAGNOSIS — R002 Palpitations: Secondary | ICD-10-CM

## 2017-01-24 NOTE — Progress Notes (Signed)
Carelink Summary Report / Loop Recorder 

## 2017-02-06 LAB — CUP PACEART REMOTE DEVICE CHECK
Date Time Interrogation Session: 20190103171010
MDC IDC PG IMPLANT DT: 20181005

## 2017-02-12 ENCOUNTER — Ambulatory Visit (INDEPENDENT_AMBULATORY_CARE_PROVIDER_SITE_OTHER): Payer: BLUE CROSS/BLUE SHIELD | Admitting: Internal Medicine

## 2017-02-12 ENCOUNTER — Encounter: Payer: Self-pay | Admitting: Internal Medicine

## 2017-02-12 VITALS — BP 112/62 | HR 63 | Resp 16 | Ht 74.0 in | Wt 208.0 lb

## 2017-02-12 DIAGNOSIS — R002 Palpitations: Secondary | ICD-10-CM | POA: Diagnosis not present

## 2017-02-12 LAB — CUP PACEART INCLINIC DEVICE CHECK
Date Time Interrogation Session: 20190122164028
MDC IDC PG IMPLANT DT: 20181005

## 2017-02-12 NOTE — Progress Notes (Signed)
HPI [name] returns today for followup of palpitations, s/p insertion of an ILR. He is a well appearing 62 yo man who has preserved LV function and undewent insertion of an ILR and has been found to have SVT at 140-150/min. He is symptomatic but only minimally so. He denies chest pain or sob. No syncope.  Allergies  Allergen Reactions  . Penicillins Other (See Comments)    Childhood allergy      Current Outpatient Medications  Medication Sig Dispense Refill  . clonazePAM (KLONOPIN) 0.5 MG tablet Take 2 mg by mouth at bedtime.  2  . MAGNESIUM PO Take 1 tablet by mouth every morning.    . metoprolol tartrate (LOPRESSOR) 25 MG tablet Take 1 tablet by mouth as needed for palpitations 30 tablet 0   No current facility-administered medications for this visit.      Past Medical History:  Diagnosis Date  . Depression   . Fibromyalgia   . GERD (gastroesophageal reflux disease)    w/ LA Grade A erosive esophagitis  . Hiatal hernia   . IBS (irritable bowel syndrome)   . Internal hemorrhoids     ROS:   All systems reviewed and negative except as noted in the HPI.   Past Surgical History:  Procedure Laterality Date  . LAPAROSCOPIC NISSEN FUNDOPLICATION  93/7902  . LOOP RECORDER INSERTION N/A 10/26/2016   Procedure: LOOP RECORDER INSERTION;  Surgeon: Calvin Lance, MD;  Location: [facility];  Service: Cardiovascular;  Laterality: N/A;     Family History  Problem Relation Age of Onset  . Breast cancer Mother   . Colon cancer Neg Hx      Social History   Socioeconomic History  . Marital status: Married    Spouse name: Not on file  . Number of children: 2  . Years of education: Not on file  . Highest education level: Not on file  Social Needs  . Financial resource strain: Not on file  . Food insecurity - worry: Not on file  . Food insecurity - inability: Not on file  . Transportation needs - medical: Not on file  . Transportation needs - non-medical:  Not on file  Occupational History  . Not on file  Tobacco Use  . Smoking status: Never Smoker  . Smokeless tobacco: Never Used  Substance and Sexual Activity  . Alcohol use: No  . Drug use: No  . Sexual activity: Not on file  Other Topics Concern  . Not on file  Social History Narrative  . Not on file     BP 112/62   Pulse 63   Resp 16   Ht 6\' 2"  (1.88 m)   Wt 208 lb (94.3 kg)   SpO2 98%   BMI 26.71 kg/m   Physical Exam:  Well appearing 62 yo man, NAD HEENT: Unremarkable Neck:  6 cm JVD, no thyromegally Lymphatics:  No adenopathy Back:  No CVA tenderness Lungs:  Clear with no wheezes HEART:  Regular rate rhythm, no murmurs, no rubs, no clicks Abd:  soft, positive bowel sounds, no organomegally, no rebound, no guarding Ext:  2 plus pulses, no edema, no cyanosis, no clubbing Skin:  No rashes no nodules Neuro:  CN II through XII intact, motor grossly intact  EKG - nsr  DEVICE  Normal device function.  See PaceArt for details. One episode of SVT at 140-150/min  Assess/Plan: 1. Palpitations - no evidence of atrial fib but he clearly  has SVT. I have discussed the mechanism of this arrhythmia. I have asked him to reduce caffeine intake. Avoidance of ETOH and getting adequate sleep were also reviewed. 2. SVT - if his symptoms worsen, I have recommended he increase the dose of his diuretic therapy.   Calvin Conner.D.

## 2017-02-12 NOTE — Patient Instructions (Addendum)
Medication Instructions:  Your physician recommends that you continue on your current medications as directed. Please refer to the Current Medication list given to you today.  Labwork: None ordered.  Testing/Procedures: None ordered.  Follow-Up: Your physician wants you to follow-up in: One Year with Dr. Lovena Le.   You will receive a reminder letter in the mail two months in advance. If you don't receive a letter, please call our office to schedule the follow-up appointment.   Any Other Special Instructions Will Be Listed Below (If Applicable).   If you need a refill on your cardiac medications before your next appointment, please call your pharmacy.

## 2017-02-25 ENCOUNTER — Ambulatory Visit (INDEPENDENT_AMBULATORY_CARE_PROVIDER_SITE_OTHER): Payer: BLUE CROSS/BLUE SHIELD | Admitting: *Deleted

## 2017-02-25 DIAGNOSIS — R002 Palpitations: Secondary | ICD-10-CM

## 2017-02-25 NOTE — Progress Notes (Signed)
Carelink Summary Reprot / Loop Recorder 

## 2017-03-19 LAB — CUP PACEART REMOTE DEVICE CHECK
Date Time Interrogation Session: 20190202174016
MDC IDC PG IMPLANT DT: 20181005

## 2017-03-28 ENCOUNTER — Ambulatory Visit (INDEPENDENT_AMBULATORY_CARE_PROVIDER_SITE_OTHER): Payer: BLUE CROSS/BLUE SHIELD | Admitting: *Deleted

## 2017-03-28 DIAGNOSIS — R002 Palpitations: Secondary | ICD-10-CM | POA: Diagnosis not present

## 2017-03-29 NOTE — Progress Notes (Signed)
Carelink Summary Report / Loop Recorder 

## 2017-04-08 ENCOUNTER — Telehealth: Payer: Self-pay

## 2017-04-08 NOTE — Telephone Encounter (Signed)
Spoke with patient regarding ILR transmission. I requested that patient send manual transmission and walked him through how to do this. Patient states that symptoms at time of symptom activator where "rapid heart rate". Patient denies syncope or pre-syncope. Patient will send remote transmission

## 2017-04-09 NOTE — Telephone Encounter (Signed)
Spoke with patient regarding symptom/tachy episodes from 04/05/17.  Patient reports he did not try taking metoprolol as his symptoms were short in duration (tachy episode duration 39sec).  He is aware he can try taking it for longer episodes of symptomatic palpitations.  Patient reports he is following Dr. Tanna Furry recommendations to avoid ETOH, reduce caffeine intake, and get adequate sleep.  He has not noticed any triggers for his SVT.  Advised that Dr. Lovena Le will review episode and we will call back if any further recommendations.  Patient is appreciative and denies additional questions or concerns at this time.  ECGs printed and placed in Dr. Macon Large folder for review.

## 2017-04-30 ENCOUNTER — Ambulatory Visit (INDEPENDENT_AMBULATORY_CARE_PROVIDER_SITE_OTHER): Payer: BLUE CROSS/BLUE SHIELD | Admitting: *Deleted

## 2017-04-30 DIAGNOSIS — R002 Palpitations: Secondary | ICD-10-CM | POA: Diagnosis not present

## 2017-05-01 NOTE — Progress Notes (Signed)
Carelink Summary Report / Loop Recorder 

## 2017-05-09 LAB — CUP PACEART REMOTE DEVICE CHECK
Date Time Interrogation Session: 20190307183904
Implantable Pulse Generator Implant Date: 20181005

## 2017-05-30 ENCOUNTER — Other Ambulatory Visit: Payer: Self-pay | Admitting: Internal Medicine

## 2017-06-03 ENCOUNTER — Ambulatory Visit (INDEPENDENT_AMBULATORY_CARE_PROVIDER_SITE_OTHER): Payer: BLUE CROSS/BLUE SHIELD | Admitting: *Deleted

## 2017-06-03 DIAGNOSIS — R002 Palpitations: Secondary | ICD-10-CM | POA: Diagnosis not present

## 2017-06-03 LAB — CUP PACEART REMOTE DEVICE CHECK
Date Time Interrogation Session: 20190409193924
MDC IDC PG IMPLANT DT: 20181005

## 2017-06-03 NOTE — Progress Notes (Signed)
Carelink Summary Report / Loop Recorder 

## 2017-06-26 LAB — CUP PACEART REMOTE DEVICE CHECK
Date Time Interrogation Session: 20190512200551
MDC IDC PG IMPLANT DT: 20181005

## 2017-07-05 ENCOUNTER — Ambulatory Visit (INDEPENDENT_AMBULATORY_CARE_PROVIDER_SITE_OTHER): Payer: BLUE CROSS/BLUE SHIELD | Admitting: *Deleted

## 2017-07-05 DIAGNOSIS — R002 Palpitations: Secondary | ICD-10-CM | POA: Diagnosis not present

## 2017-07-08 NOTE — Progress Notes (Signed)
Carelink Summary Report / Loop Recorder 

## 2017-07-22 ENCOUNTER — Telehealth: Payer: Self-pay | Admitting: *Deleted

## 2017-07-22 NOTE — Telephone Encounter (Signed)
Symptom episode from 6/28 @ 19:49 reviewed - tachy @ ~150bpm.  Called patient about sx's. Wife will have patient to call back.

## 2017-07-22 NOTE — Telephone Encounter (Signed)
Patient called back about symptom episode from 6/28. Patient c/o "mild symptoms" - palps, but denied CP, ShOB. Patient states that he did not have to take PRN Lopressor.   Will place episode in Dr.Taylor's linq folder and notify patient if anything is recommended. Patient verbalized understanding.

## 2017-08-07 ENCOUNTER — Ambulatory Visit (INDEPENDENT_AMBULATORY_CARE_PROVIDER_SITE_OTHER): Payer: BLUE CROSS/BLUE SHIELD | Admitting: *Deleted

## 2017-08-07 DIAGNOSIS — R002 Palpitations: Secondary | ICD-10-CM

## 2017-08-08 NOTE — Progress Notes (Signed)
Carelink Summary Report / Loop Recorder 

## 2017-08-09 LAB — CUP PACEART REMOTE DEVICE CHECK
Implantable Pulse Generator Implant Date: 20181005
MDC IDC SESS DTM: 20190614221005

## 2017-09-09 ENCOUNTER — Ambulatory Visit (INDEPENDENT_AMBULATORY_CARE_PROVIDER_SITE_OTHER): Payer: BLUE CROSS/BLUE SHIELD | Admitting: *Deleted

## 2017-09-09 DIAGNOSIS — R748 Abnormal levels of other serum enzymes: Secondary | ICD-10-CM | POA: Diagnosis not present

## 2017-09-09 DIAGNOSIS — R7989 Other specified abnormal findings of blood chemistry: Principal | ICD-10-CM

## 2017-09-09 DIAGNOSIS — R778 Other specified abnormalities of plasma proteins: Secondary | ICD-10-CM

## 2017-09-10 ENCOUNTER — Other Ambulatory Visit: Payer: Self-pay | Admitting: Internal Medicine

## 2017-09-10 NOTE — Progress Notes (Signed)
Carelink Summary Report / Loop Recorder 

## 2017-09-24 LAB — CUP PACEART REMOTE DEVICE CHECK
Implantable Pulse Generator Implant Date: 20181005
MDC IDC SESS DTM: 20190717223732

## 2017-10-14 ENCOUNTER — Ambulatory Visit (INDEPENDENT_AMBULATORY_CARE_PROVIDER_SITE_OTHER): Payer: BLUE CROSS/BLUE SHIELD | Admitting: *Deleted

## 2017-10-14 DIAGNOSIS — R002 Palpitations: Secondary | ICD-10-CM | POA: Diagnosis not present

## 2017-10-14 LAB — CUP PACEART REMOTE DEVICE CHECK
Date Time Interrogation Session: 20190819233647
Implantable Pulse Generator Implant Date: 20181005

## 2017-10-14 NOTE — Progress Notes (Signed)
Carelink Summary Report / Loop Recorder 

## 2017-10-21 LAB — CUP PACEART REMOTE DEVICE CHECK
Implantable Pulse Generator Implant Date: 20181005
MDC IDC SESS DTM: 20190922000523

## 2017-11-14 ENCOUNTER — Ambulatory Visit (INDEPENDENT_AMBULATORY_CARE_PROVIDER_SITE_OTHER): Payer: BLUE CROSS/BLUE SHIELD | Admitting: *Deleted

## 2017-11-14 DIAGNOSIS — I442 Atrioventricular block, complete: Secondary | ICD-10-CM

## 2017-11-14 DIAGNOSIS — R002 Palpitations: Secondary | ICD-10-CM

## 2017-11-15 NOTE — Progress Notes (Signed)
Remote pacemaker transmission.   

## 2017-11-29 LAB — CUP PACEART REMOTE DEVICE CHECK
Date Time Interrogation Session: 20191025001016
Implantable Pulse Generator Implant Date: 20181005

## 2017-12-17 ENCOUNTER — Ambulatory Visit (INDEPENDENT_AMBULATORY_CARE_PROVIDER_SITE_OTHER): Payer: BLUE CROSS/BLUE SHIELD

## 2017-12-17 DIAGNOSIS — R002 Palpitations: Secondary | ICD-10-CM | POA: Diagnosis not present

## 2017-12-18 NOTE — Progress Notes (Signed)
Carelink Summary Report / Loop Recorder 

## 2018-01-02 ENCOUNTER — Telehealth: Payer: Self-pay | Admitting: Cardiology

## 2018-01-02 NOTE — Telephone Encounter (Signed)
LVM for return call regarding pt's remote transmission.

## 2018-01-02 NOTE — Telephone Encounter (Signed)
LMOVM for pt to send a manual transmission. 

## 2018-01-03 NOTE — Telephone Encounter (Signed)
Called pt to discuss his episodes of tachycardia on 12/11 @ 0400, [place], [place], and 0536. Pt states he felt his symptoms were ongoing from 0400 until after 0600. He took his PRN metoprolol at 0540 and 0615. Pt denies feeling light headed or passing out during these episodes. Pt understands we will send to Dr Lovena Le for further recommendation, if any.

## 2018-01-07 NOTE — Telephone Encounter (Signed)
LMOM (DPR) advising that Dr. Lovena Le reviewed ECG strips and recommended discussing further at upcoming appointment on 02/18/18. [facility] phone number for questions/concerns or recurrent symptoms prior to this appointment.

## 2018-01-16 ENCOUNTER — Other Ambulatory Visit: Payer: Self-pay | Admitting: Internal Medicine

## 2018-01-20 ENCOUNTER — Ambulatory Visit (INDEPENDENT_AMBULATORY_CARE_PROVIDER_SITE_OTHER): Payer: BLUE CROSS/BLUE SHIELD

## 2018-01-20 DIAGNOSIS — R002 Palpitations: Secondary | ICD-10-CM

## 2018-01-20 NOTE — Progress Notes (Signed)
Carelink Summary Report / Loop Recorder 

## 2018-01-21 LAB — CUP PACEART REMOTE DEVICE CHECK
Date Time Interrogation Session: 20191230010925
MDC IDC PG IMPLANT DT: 20181005

## 2018-02-02 LAB — CUP PACEART REMOTE DEVICE CHECK
Implantable Pulse Generator Implant Date: 20181005
MDC IDC SESS DTM: 20191127004045

## 2018-02-18 ENCOUNTER — Encounter: Payer: Self-pay | Admitting: Internal Medicine

## 2018-02-18 ENCOUNTER — Ambulatory Visit (INDEPENDENT_AMBULATORY_CARE_PROVIDER_SITE_OTHER): Payer: BLUE CROSS/BLUE SHIELD | Admitting: Internal Medicine

## 2018-02-18 VITALS — BP 110/78 | HR 68 | Ht 74.0 in | Wt 196.0 lb

## 2018-02-18 DIAGNOSIS — R002 Palpitations: Secondary | ICD-10-CM

## 2018-02-18 NOTE — Patient Instructions (Addendum)
Medication Instructions:  Your physician recommends that you continue on your current medications as directed. Please refer to the Current Medication list given to you today.  Labwork: None ordered.  Testing/Procedures: Your physician has recommended that you have an ablation. Catheter ablation is a medical procedure used to treat some cardiac arrhythmias (irregular heartbeats). During catheter ablation, a long, thin, flexible tube is put into a blood vessel in your groin (upper thigh), or neck. This tube is called an ablation catheter. It is then guided to your heart through the blood vessel. Radio frequency waves destroy small areas of heart tissue where abnormal heartbeats may cause an arrhythmia to start. Please see the instruction sheet given to you today.  Follow-Up:  The following days are available for procedures:  February 3, 5, 10, 13, 17, 20, 24, 26 March 2, 6, 16, 20, 23, 25, 27, 30  If you decide on a day please give me a call:  Bing Neighbors [phone]    Cardiac Ablation Cardiac ablation is a procedure to disable (ablate) a small amount of heart tissue in very specific places. The heart has many electrical connections. Sometimes these connections are abnormal and can cause the heart to beat very fast or irregularly. Ablating some of the problem areas can improve the heart rhythm or return it to normal. Ablation may be done for people who:  Have Wolff-Parkinson-White syndrome.  Have fast heart rhythms (tachycardia).  Have taken medicines for an abnormal heart rhythm (arrhythmia) that were not effective or caused side effects.  Have a high-risk heartbeat that may be life-threatening. During the procedure, a small incision is made in the neck or the groin, and a long, thin, flexible tube (catheter) is inserted into the incision and moved to the heart. Small devices (electrodes) on the tip of the catheter will send out electrical currents. A type of X-ray (fluoroscopy) will be  used to help guide the catheter and to provide images of the heart. Tell a health care provider about:  Any allergies you have.  All medicines you are taking, including vitamins, herbs, eye drops, creams, and over-the-counter medicines.  Any problems you or family members have had with anesthetic medicines.  Any blood disorders you have.  Any surgeries you have had.  Any medical conditions you have, such as kidney failure.  Whether you are pregnant or may be pregnant. What are the risks? Generally, this is a safe procedure. However, problems may occur, including:  Infection.  Bruising and bleeding at the catheter insertion site.  Bleeding into the chest, especially into the sac that surrounds the heart. This is a serious complication.  Stroke or blood clots.  Damage to other structures or organs.  Allergic reaction to medicines or dyes.  Need for a permanent pacemaker if the normal electrical system is damaged. A pacemaker is a small computer that sends electrical signals to the heart and helps your heart beat normally.  The procedure not being fully effective. This may not be recognized until months later. Repeat ablation procedures are sometimes required. What happens before the procedure?  Follow instructions from your health care provider about eating or drinking restrictions.  Ask your health care provider about: ? Changing or stopping your regular medicines. This is especially important if you are taking diabetes medicines or blood thinners. ? Taking medicines such as aspirin and ibuprofen. These medicines can thin your blood. Do not take these medicines before your procedure if your health care provider instructs you not to.  Plan  to have someone take you home from the hospital or clinic.  If you will be going home right after the procedure, plan to have someone with you for 24 hours. What happens during the procedure?  To lower your risk of infection: ? Your  health care team will wash or sanitize their hands. ? Your skin will be washed with soap. ? Hair may be removed from the incision area.  An IV tube will be inserted into one of your veins.  You will be given a medicine to help you relax (sedative).  The skin on your neck or groin will be numbed.  An incision will be made in your neck or your groin.  A needle will be inserted through the incision and into a large vein in your neck or groin.  A catheter will be inserted into the needle and moved to your heart.  Dye may be injected through the catheter to help your surgeon see the area of the heart that needs treatment.  Electrical currents will be sent from the catheter to ablate heart tissue in desired areas. There are three types of energy that may be used to ablate heart tissue: ? Heat (radiofrequency energy). ? Laser energy. ? Extreme cold (cryoablation).  When the necessary tissue has been ablated, the catheter will be removed.  Pressure will be held on the catheter insertion area to prevent excessive bleeding.  A bandage (dressing) will be placed over the catheter insertion area. The procedure may vary among health care providers and hospitals. What happens after the procedure?  Your blood pressure, heart rate, breathing rate, and blood oxygen level will be monitored until the medicines you were given have worn off.  Your catheter insertion area will be monitored for bleeding. You will need to lie still for a few hours to ensure that you do not bleed from the catheter insertion area.  Do not drive for 24 hours or as long as directed by your health care provider. Summary  Cardiac ablation is a procedure to disable (ablate) a small amount of heart tissue in very specific places. Ablating some of the problem areas can improve the heart rhythm or return it to normal.  During the procedure, electrical currents will be sent from the catheter to ablate heart tissue in desired  areas. This information is not intended to replace advice given to you by your health care provider. Make sure you discuss any questions you have with your health care provider. Document Released: 05/27/2008 Document Revised: 11/28/2015 Document Reviewed: 11/28/2015 Elsevier Interactive Patient Education  2019 [employer].

## 2018-02-18 NOTE — Progress Notes (Signed)
HPI Calvin Conner returns today for followup of palpitations, s/p insertion of an ILR. He is a well appearing 63 yo man who has preserved LV function and undewent insertion of an ILR and has been found to have SVT at 170/min. He is symptomatic but only minimally so. He denies chest pain or sob. No syncope.He notes that his father had atrial fib.   Allergies  Allergen Reactions  . Penicillins Other (See Comments)    Childhood allergy      Current Outpatient Medications  Medication Sig Dispense Refill  . fluconazole (DIFLUCAN) 200 MG tablet Take 1 tablet by mouth daily.    . metoprolol tartrate (LOPRESSOR) 25 MG tablet Take 1 tablet by mouth as needed for palpitations 30 tablet 0   No current facility-administered medications for this visit.      Past Medical History:  Diagnosis Date  . Depression   . Fibromyalgia   . GERD (gastroesophageal reflux disease)    w/ LA Grade A erosive esophagitis  . Hiatal hernia   . IBS (irritable bowel syndrome)   . Internal hemorrhoids     ROS:   All systems reviewed and negative except as noted in the HPI.   Past Surgical History:  Procedure Laterality Date  . LAPAROSCOPIC NISSEN FUNDOPLICATION  80/0349  . LOOP RECORDER INSERTION N/A 10/26/2016   Procedure: LOOP RECORDER INSERTION;  Surgeon: Evans Lance, MD;  Location: [facility];  Service: Cardiovascular;  Laterality: N/A;     Family History  Problem Relation Age of Onset  . Breast cancer Mother   . Colon cancer Neg Hx      Social History   Socioeconomic History  . Marital status: Married    Spouse name: Not on file  . Number of children: 2  . Years of education: Not on file  . Highest education level: Not on file  Occupational History  . Not on file  Social Needs  . Financial resource strain: Not on file  . Food insecurity:    Worry: Not on file    Inability: Not on file  . Transportation needs:    Medical: Not on file    Non-medical: Not on file   Tobacco Use  . Smoking status: Never Smoker  . Smokeless tobacco: Never Used  Substance and Sexual Activity  . Alcohol use: No  . Drug use: No  . Sexual activity: Not on file  Lifestyle  . Physical activity:    Days per week: Not on file    Minutes per session: Not on file  . Stress: Not on file  Relationships  . Social connections:    Talks on phone: Not on file    Gets together: Not on file    Attends religious service: Not on file    Active member of club or organization: Not on file    Attends meetings of clubs or organizations: Not on file    Relationship status: Not on file  . Intimate partner violence:    Fear of current or ex partner: Not on file    Emotionally abused: Not on file    Physically abused: Not on file    Forced sexual activity: Not on file  Other Topics Concern  . Not on file  Social History Narrative  . Not on file     BP 110/78   Pulse 68   Ht 6\' 2"  (1.88 m)   Wt 196 lb (88.9 kg)  SpO2 96%   BMI 25.16 kg/m   Physical Exam:  Well appearing NAD HEENT: Unremarkable Neck:  6 cm JVD, no thyromegally Lymphatics:  No adenopathy Back:  No CVA tenderness Lungs:  Clear with no wheezes HEART:  Regular rate rhythm, no murmurs, no rubs, no clicks Abd:  soft, positive bowel sounds, no organomegally, no rebound, no guarding Ext:  2 plus pulses, no edema, no cyanosis, no clubbing Skin:  No rashes no nodules Neuro:  CN II through XII intact, motor grossly intact   DEVICE  Normal device function.  See PaceArt for details. SVT  Assess/Plan: 1. Recurrent symptomatic SVT - I have reviewed his ILR. He has had multiple symptomatic episodes of SVT despite medical therapy with beta blockers. I have discussed the indications/risks/benefits/goals/expectations of EP study and catheter ablation and he will call us if he wishes to proceed. I spent over 25 minutes including 50% face to face time.  Cristopher Peru, M.D.

## 2018-02-19 LAB — CUP PACEART INCLINIC DEVICE CHECK
Date Time Interrogation Session: 20200128143703
MDC IDC PG IMPLANT DT: 20181005

## 2018-02-21 ENCOUNTER — Ambulatory Visit (INDEPENDENT_AMBULATORY_CARE_PROVIDER_SITE_OTHER): Payer: BLUE CROSS/BLUE SHIELD

## 2018-02-21 DIAGNOSIS — R002 Palpitations: Secondary | ICD-10-CM | POA: Diagnosis not present

## 2018-02-26 LAB — CUP PACEART REMOTE DEVICE CHECK
Implantable Pulse Generator Implant Date: 20181005
MDC IDC SESS DTM: 20200201013848

## 2018-02-28 NOTE — Progress Notes (Signed)
Carelink Summary Report / Loop Recorder 

## 2018-03-04 ENCOUNTER — Telehealth: Payer: Self-pay

## 2018-03-04 NOTE — Telephone Encounter (Signed)
LVM for pt to send in manual transmission d/t pt pressing symptom activator

## 2018-03-05 NOTE — Telephone Encounter (Addendum)
LMOVM and LMOM requesting call back to DC regarding patient's symptoms during "symptom" episode on 03/03/18. Manual transmission received and shows symptom + detected tachy episode, ECG suggests SVT.   Per Dr. Tanna Furry OV note from 02/18/18, Dr. Lovena Le recommended SVT ablation. Will ask patient if he wishes to schedule procedure.

## 2018-03-05 NOTE — Telephone Encounter (Signed)
Patient returned call. He took his PRN metoprolol during this episode and reports the tachycardia went away after about an hour. He wishes to wait until this summer to schedule his ablation as he will be eligible for Medicare at that time. Advised to call us in the meantime if he wishes to move up procedure, or if he is experiencing worsening symptoms or more frequent episodes. Patient verbalizes understanding and agreement with plan.  Will make Sonia Baller, RN, aware of patient's wishes regarding ablation.

## 2018-03-18 ENCOUNTER — Encounter (HOSPITAL_COMMUNITY): Payer: Self-pay | Admitting: Emergency Medicine

## 2018-03-18 ENCOUNTER — Emergency Department (HOSPITAL_COMMUNITY)
Admission: EM | Admit: 2018-03-18 | Discharge: 2018-03-18 | Disposition: A | Discharge status: Home / Self Care | Payer: BLUE CROSS/BLUE SHIELD | Attending: Emergency Medicine | Admitting: Emergency Medicine

## 2018-03-18 ENCOUNTER — Emergency Department (HOSPITAL_COMMUNITY): Payer: BLUE CROSS/BLUE SHIELD

## 2018-03-18 ENCOUNTER — Other Ambulatory Visit: Payer: Self-pay

## 2018-03-18 DIAGNOSIS — R001 Bradycardia, unspecified: Secondary | ICD-10-CM | POA: Insufficient documentation

## 2018-03-18 DIAGNOSIS — Z79899 Other long term (current) drug therapy: Secondary | ICD-10-CM | POA: Insufficient documentation

## 2018-03-18 DIAGNOSIS — R002 Palpitations: Secondary | ICD-10-CM | POA: Diagnosis not present

## 2018-03-18 DIAGNOSIS — I959 Hypotension, unspecified: Secondary | ICD-10-CM

## 2018-03-18 DIAGNOSIS — R000 Tachycardia, unspecified: Secondary | ICD-10-CM | POA: Diagnosis present

## 2018-03-18 LAB — CBC WITH DIFFERENTIAL/PLATELET
Abs Immature Granulocytes: 0.02 10*3/uL (ref 0.00–0.07)
Basophils Absolute: 0 10*3/uL (ref 0.0–0.1)
Basophils Relative: 0 %
EOS ABS: 0 10*3/uL (ref 0.0–0.5)
Eosinophils Relative: 1 %
HCT: 43.6 % (ref 39.0–52.0)
Hemoglobin: 14.6 g/dL (ref 13.0–17.0)
Immature Granulocytes: 0 %
Lymphocytes Relative: 19 %
Lymphs Abs: 1 10*3/uL (ref 0.7–4.0)
MCH: 31.4 pg (ref 26.0–34.0)
MCHC: 33.5 g/dL (ref 30.0–36.0)
MCV: 93.8 fL (ref 80.0–100.0)
Monocytes Absolute: 0.4 10*3/uL (ref 0.1–1.0)
Monocytes Relative: 7 %
NEUTROS PCT: 73 %
Neutro Abs: 4 10*3/uL (ref 1.7–7.7)
Platelets: 185 10*3/uL (ref 150–400)
RBC: 4.65 MIL/uL (ref 4.22–5.81)
RDW: 12.8 % (ref 11.5–15.5)
WBC: 5.5 10*3/uL (ref 4.0–10.5)
nRBC: 0 % (ref 0.0–0.2)

## 2018-03-18 LAB — COMPREHENSIVE METABOLIC PANEL
ALT: 27 U/L (ref 0–44)
AST: 24 U/L (ref 15–41)
Albumin: 3.7 g/dL (ref 3.5–5.0)
Alkaline Phosphatase: 33 U/L — ABNORMAL LOW (ref 38–126)
Anion gap: 7 (ref 5–15)
BUN: 16 mg/dL (ref 8–23)
CO2: 23 mmol/L (ref 22–32)
Calcium: 8.9 mg/dL (ref 8.9–10.3)
Chloride: 109 mmol/L (ref 98–111)
Creatinine, Ser: 0.99 mg/dL (ref 0.61–1.24)
GFR calc non Af Amer: >60 mL/min (ref >60)
Glucose, Bld: 150 mg/dL — ABNORMAL HIGH (ref 70–99)
POTASSIUM: 4.5 mmol/L (ref 3.5–5.1)
Sodium: 139 mmol/L (ref 135–145)
Total Bilirubin: 0.6 mg/dL (ref 0.3–1.2)
Total Protein: 6.1 g/dL — ABNORMAL LOW (ref 6.5–8.1)

## 2018-03-18 LAB — I-STAT TROPONIN, ED: Troponin i, poc: 0 ng/mL (ref 0.00–0.08)

## 2018-03-18 MED ORDER — SODIUM CHLORIDE 0.9 % IV SOLN
INTRAVENOUS | Status: DC
  Administered 2018-03-18: 15:00:00 via INTRAVENOUS

## 2018-03-18 MED ORDER — SODIUM CHLORIDE 0.9 % IV BOLUS
500.0000 mL | Freq: Once | INTRAVENOUS | Status: AC
  Administered 2018-03-18: 500 mL via INTRAVENOUS

## 2018-03-18 NOTE — ED Provider Notes (Signed)
63 yo M here for palpitations.  I received the patient in signout from Dr. Rogene Houston briefly the patient has a known history of SVT and came to the ED for palpitations and a near syncopal event.  He has been feeling much better and is awaiting cardiology evaluation.  He was seen by the midlevel provider who in their initial note recommended discharging him home though we have been awaiting evaluation by Dr. Rayann Heman.  The patient has been waiting for about 4 hours since the PA saw him, he elects to leave at this time and will follow up with his cardiologist.  He states that he will call them in the morning.   Deno Etienne, DO 03/18/18 1713

## 2018-03-18 NOTE — ED Notes (Signed)
MD made aware of patient's blood pressure and heart rate. Pt does not have any complaints at this time.

## 2018-03-18 NOTE — Consult Note (Addendum)
Cardiology Consultation:   Patient ID: [name] MRN: [id number]; DOB: [birth year]  Admit date: 03/18/2018 Date of Consult: 03/18/2018  Primary Care Provider: Modena Nunnery, MD Primary Cardiologist: Dr Debara Pickett Primary Electrophysiologist:  Dr. Lovena Le   Patient Profile:   Calvin Conner is a 63 y.o. male with a hx of GERD, IBS, fibromyalgia, and an SVT who is being seen today for the evaluation of palpitations, w/h/o SVT s/p PRN metoprolol with resultant brdaycardia at the request of Dr. Rogene Houston.  History of Present Illness:   [name] has ILR in place in 2018 with c/o palpitations back then subsequently found to have an SVT as fast as 170bpm.  He last saw Dr. Lovena Le Jan 2020, discussed his SVT,, reported to be minimally symptomatic, recommended EPS/ablation.  In review of phone notes, the patient wanted to hold off until the summer after he would be on medicare.  The patient was woken this AM with recurrent palpitations, associated with nausea, diaphoresis, and dizziness, over the course of 4 hours had taken 25mg  of lopressor total  x4 doses, called 911 with resolved palpitations but profound weakness EMS found the pt in SR, 60's-70's, BP 100'2/70'-90, minimal BP change with orthostatic check.  Pt reported to them the palpitations had stopped though continued to feel dizzy and nauseous  EKG in the ER with SR, 65bpm, 109/70.  Cardiology consulted for observation of HR into 40's, SBP 90's, given his metoprolol doses at home.  As above the patient was woke by palpitations, initially quite asymptomatic outside of being aware of his tachycardia, he reports taking a dose of 25mg  lopressor about hourly for a total of 4 doses.   This event of tachycardia became different then his usual, in that typically would stop after the 2nd dose, this the 1st time he has required more, or has lasted beyond a couple hours.  After taking his 4th dose of metoprolol, he became weak, diaphoretic, developed GI  symptoms of watery diarrhea, and became so weak, laid on the floor, this when EMS was called, unable to get up.  His wife described him as pale, extremely weak, but arousable.    He has since recovered, feels absolutely back to his baseline, he is in SR/SB 50's as I am at bedside, SBP 108. He reports the only change this year with the sudden up-tic in his SVT is that he stopped his sleeping medicine (Klonipin) in [place], though has had SVT at any hour of day, this only the 2nd that woke him, he has been sleeping well without the klonipin.  He had wanted to hold off on the ablation until he was on medicare though having so many episodes this year he would like it moved up to ASAP.   LABS K+ 4.5 BUN/Creat 16/0.99 poc Trop 0.00 WBC 5.5 H/H 14/43 Plts 185  Device information MDT ILR, implanted 10/26/16, palpitations  Past Medical History:  Diagnosis Date  . Depression   . Fibromyalgia   . GERD (gastroesophageal reflux disease)    w/ LA Grade A erosive esophagitis  . Hiatal hernia   . IBS (irritable bowel syndrome)   . Internal hemorrhoids     Past Surgical History:  Procedure Laterality Date  . LAPAROSCOPIC NISSEN FUNDOPLICATION  26/9485  . LOOP RECORDER INSERTION N/A 10/26/2016   Procedure: LOOP RECORDER INSERTION;  Surgeon: Evans Lance, MD;  Location: [facility];  Service: Cardiovascular;  Laterality: N/A;     Home Medications:  Prior to Admission medications  Medication Sig Start Date End Date Taking? Authorizing Provider  metoprolol tartrate (LOPRESSOR) 25 MG tablet Take 1 tablet by mouth as needed for palpitations 11/22/16  Yes Hilty, Nadean Corwin, MD    Inpatient Medications: Scheduled Meds:  Continuous Infusions:  PRN Meds:   Allergies:    Allergies  Allergen Reactions  . Penicillins Other (See Comments)    Childhood allergy   Did it involve swelling of the face/tongue/throat, SOB, or low BP? Unknown Did it involve sudden or severe rash/hives, skin  peeling, or any reaction on the inside of your mouth or nose? Unknown Did you need to seek medical attention at a hospital or doctor's office? Unknown When did it last happen? If all above answers are "NO", may proceed with cephalosporin use.     Social History:   Social History   Socioeconomic History  . Marital status: Married    Spouse name: Not on file  . Number of children: 2  . Years of education: Not on file  . Highest education level: Not on file  Occupational History  . Not on file  Social Needs  . Financial resource strain: Not on file  . Food insecurity:    Worry: Not on file    Inability: Not on file  . Transportation needs:    Medical: Not on file    Non-medical: Not on file  Tobacco Use  . Smoking status: Never Smoker  . Smokeless tobacco: Never Used  Substance and Sexual Activity  . Alcohol use: No  . Drug use: No  . Sexual activity: Not on file  Lifestyle  . Physical activity:    Days per week: Not on file    Minutes per session: Not on file  . Stress: Not on file  Relationships  . Social connections:    Talks on phone: Not on file    Gets together: Not on file    Attends religious service: Not on file    Active member of club or organization: Not on file    Attends meetings of clubs or organizations: Not on file    Relationship status: Not on file  . Intimate partner violence:    Fear of current or ex partner: Not on file    Emotionally abused: Not on file    Physically abused: Not on file    Forced sexual activity: Not on file  Other Topics Concern  . Not on file  Social History Narrative  . Not on file    Family History:   Family History  Problem Relation Age of Onset  . Breast cancer Mother   . Colon cancer Neg Hx      ROS:  Please see the history of present illness.  All other ROS reviewed and negative.     Physical Exam/Data:   Vitals:   03/18/18 1000 03/18/18 1015 03/18/18 1045 03/18/18 1115  BP: 95/69 96/66 97/70   92/68  Pulse: (!) 52 (!) 50 (!) 51 (!) 49  Resp: 18 17 12 15   Temp:      TempSrc:      SpO2: 99% 99% 99% 98%  Weight:      Height:       No intake or output data in the 24 hours ending 03/18/18 1217 Last 3 Weights 03/18/2018 02/18/2018 02/12/2017  Weight (lbs) 190 lb 196 lb 208 lb  Weight (kg) 86.183 kg 88.905 kg 94.348 kg     Body mass index is 24.39 kg/m.  General:  Well nourished,  well developed, in no acute distress HEENT: normal Lymph: no adenopathy Neck: no JVD Endocrine:  No thryomegaly Vascular: No carotid bruits  Cardiac:  RRR; no murmurs, gallops or rubs Lungs:  CTA b/l, no wheezing, rhonchi or rales  Abd: soft, nontender Ext: no edema Musculoskeletal:  No deformities Skin: warm and dry  Neuro: no focal abnormalities noted Psych:  Normal affect   EKG:  The EKG was personally reviewed and demonstrates:   SR 65bpm, normal intervals 02/18/2018 SR 68bpm, normal intervals  Telemetry:  Telemetry was personally reviewed and demonstrates:    SB/SR, 40's >>> 50's currently  Relevant CV Studies:  11/06/16: TTE Study Conclusions - Left ventricle: The cavity size was normal. Wall thickness was   normal. Systolic function was normal. The estimated ejection   fraction was in the range of 60% to 65%. Wall motion was normal;   there were no regional wall motion abnormalities. Features are   consistent with a pseudonormal left ventricular filling pattern,   with concomitant abnormal relaxation and increased filling   pressure (grade 2 diastolic dysfunction). - Right atrium: The atrium was mildly dilated. - Pulmonary arteries: Systolic pressure was mildly increased. PA   peak pressure: 34 mm Hg (S).  11/06/16; stess myoview  Nuclear stress EF: 50%.  Blood pressure demonstrated a normal response to exercise.  There was no ST segment deviation noted during stress.  This is a low risk study.  The left ventricular ejection fraction is mildly decreased (45-54%). 1.  Good exercise tolerance, no evidence for ischemia by exercise ECG.  2. EF 50%, mild diffuse hypokinesis.  3. Fixed small, mild basal to mid inferior perfusion defect.  Possible diaphragmatic attenuation.  Low risk study.    Laboratory Data:  Chemistry Recent Labs  Lab 03/18/18 0845  NA 139  K 4.5  CL 109  CO2 23  GLUCOSE 150*  BUN 16  CREATININE 0.99  CALCIUM 8.9  GFRNONAA >60  GFRAA >60  ANIONGAP 7    Recent Labs  Lab 03/18/18 0845  PROT 6.1*  ALBUMIN 3.7  AST 24  ALT 27  ALKPHOS 33*  BILITOT 0.6   Hematology Recent Labs  Lab 03/18/18 0845  WBC 5.5  RBC 4.65  HGB 14.6  HCT 43.6  MCV 93.8  MCH 31.4  MCHC 33.5  RDW 12.8  PLT 185   Cardiac EnzymesNo results for input(s): TROPONINI in the last 168 hours.  Recent Labs  Lab 03/18/18 0923  TROPIPOC 0.00    BNPNo results for input(s): BNP, PROBNP in the last 168 hours.  DDimer No results for input(s): DDIMER in the last 168 hours.  Radiology/Studies:   Dg Chest 2 View Result Date: 03/18/2018 CLINICAL DATA:  Shortness of breath, palpitations and diaphoresis beginning this morning. EXAM: CHEST - 2 VIEW COMPARISON:  10/23/2016 FINDINGS: The heart size and mediastinal contours are within normal limits. Both lungs are clear. The visualized skeletal structures are unremarkable. IMPRESSION: No active cardiopulmonary disease. Electronically Signed   By: Earle Gell M.D.   On: 03/18/2018 10:23    Assessment and Plan:   1. SVT     Known for the patient  ILR interrogation reviewed, logged as a symptom event, approx 140bpm (programmed tachy >167, >16beats)  2. Near syncope/profound weakness      Observed SB, hypotension here     Most likely 2/2 to total of 100mg  lopressor he had taken this AM      His HR and BP are improving He is  without active symptoms.  I do not anticipate need for admission given his HR and BP are improving I have instructed the patient not to exceed 2 doses of the PRN lopressor going  forward and if not resolved his SVT to seek attention plan for EPS/ablation with Dr. Lovena Le as soon as his schedule allows  Dr. Rayann Heman will see the patient for final recommendations     For questions or updates, please contact [facility] Please consult [email or web address] for contact info under     Signed, Baldwin Jamaica, PA-C  03/18/2018 12:17 PM    PT was discharged before I could see him. He needs follow-up with Dr Lovena Le to arrange ablation.  Thompson Grayer MD, [facility] 03/18/2018 9:30 PM

## 2018-03-18 NOTE — ED Triage Notes (Signed)
Per EMS, pt woke up this morning feeling tachycardic and nauseous. Pt has hx of SVT, took 4 25mg  metoprolol for palpitations. Pt now reports no symptoms. EMS gave 270ml fluid bolus. EMS VSS, NSR per EMS. 20g L AC.

## 2018-03-18 NOTE — ED Provider Notes (Signed)
[facility]PART[facility] Provider Note   CSN: [id number] Arrival date & time: 03/18/18  5974    History   Chief Complaint Chief Complaint  Patient presents with  . Tachycardia    HPI Calvin Conner is a 63 y.o. male.     Patient followed by cardiology in particular followed by Dr. Lovena Le has a loop recorder.  Has been struggling with bouts of SVT.  Patient taking metoprolol the beta-blocker to help control this.  Recently seen by cardiology and they recommend that he consider ablation.  Patient with onset of rapid heart rate at about 3:30 in the morning.  It persisted.  Eventually called EMS.  Ended up taking a total of 100 mg meta propanol.  So 4 doses of the 25 mg.  Last dose was taken at about 7 in the morning.  During the ambulance ride here the heart rate went back to normal.  Patient stated he had no chest pain he did have some nausea and shortness of breath and diaphoresis.  Did have one episode of a loose bowel movement.  Now feels fine.     Past Medical History:  Diagnosis Date  . Depression   . Fibromyalgia   . GERD (gastroesophageal reflux disease)    w/ LA Grade A erosive esophagitis  . Hiatal hernia   . IBS (irritable bowel syndrome)   . Internal hemorrhoids     Patient Active Problem List   Diagnosis Date Noted  . Elevated troponin 10/24/2016  . Palpitation 10/24/2016  . SOB (shortness of breath) 10/24/2016  . Anxiety 10/24/2016  . Tachycardia   . HIATAL HERNIA 09/07/2008  . Depression 05/30/2007  . GERD 05/30/2007  . IRRITABLE BOWEL SYNDROME 05/30/2007  . FIBROMYALGIA 05/30/2007    Past Surgical History:  Procedure Laterality Date  . LAPAROSCOPIC NISSEN FUNDOPLICATION  16/3845  . LOOP RECORDER INSERTION N/A 10/26/2016   Procedure: LOOP RECORDER INSERTION;  Surgeon: Evans Lance, MD;  Location: [facility];  Service: Cardiovascular;  Laterality: N/A;        Home Medications    Prior to Admission medications    Medication Sig Start Date End Date Taking? Authorizing Provider  metoprolol tartrate (LOPRESSOR) 25 MG tablet Take 1 tablet by mouth as needed for palpitations 11/22/16  Yes Hilty, Nadean Corwin, MD    Family History Family History  Problem Relation Age of Onset  . Breast cancer Mother   . Colon cancer Neg Hx     Social History Social History   Tobacco Use  . Smoking status: Never Smoker  . Smokeless tobacco: Never Used  Substance Use Topics  . Alcohol use: No  . Drug use: No     Allergies   Penicillins   Review of Systems Review of Systems  Constitutional: Positive for diaphoresis. Negative for chills and fever.  HENT: Negative for congestion, rhinorrhea and sore throat.   Eyes: Negative for visual disturbance.  Respiratory: Negative for cough and shortness of breath.   Cardiovascular: Positive for palpitations. Negative for chest pain and leg swelling.  Gastrointestinal: Positive for diarrhea and nausea. Negative for abdominal pain and vomiting.  Genitourinary: Negative for dysuria.  Musculoskeletal: Negative for back pain and neck pain.  Skin: Negative for rash.  Neurological: Negative for dizziness, light-headedness and headaches.  Hematological: Does not bruise/bleed easily.  Psychiatric/Behavioral: Negative for confusion.     Physical Exam Updated Vital Signs BP 92/68   Pulse (!) 49   Temp 97.8 F (  36.6 C) (Oral)   Resp 15   Ht 1.88 m (6\' 2" )   Wt 86.2 kg   SpO2 98%   BMI 24.39 kg/m   Physical Exam Vitals signs and nursing note reviewed.  Constitutional:      General: He is not in acute distress.    Appearance: Normal appearance. He is well-developed.  HENT:     Head: Normocephalic and atraumatic.     Mouth/Throat:     Mouth: Mucous membranes are moist.  Eyes:     Extraocular Movements: Extraocular movements intact.     Conjunctiva/sclera: Conjunctivae normal.     Pupils: Pupils are equal, round, and reactive to light.  Neck:      Musculoskeletal: Normal range of motion and neck supple.  Cardiovascular:     Rate and Rhythm: Regular rhythm. Bradycardia present.     Heart sounds: No murmur.  Pulmonary:     Effort: Pulmonary effort is normal. No respiratory distress.     Breath sounds: Normal breath sounds. No wheezing or rales.  Abdominal:     General: Abdomen is flat. Bowel sounds are normal.     Palpations: Abdomen is soft.     Tenderness: There is no abdominal tenderness.  Musculoskeletal: Normal range of motion.        General: No swelling.  Skin:    General: Skin is warm and dry.     Capillary Refill: Capillary refill takes less than 2 seconds.  Neurological:     General: No focal deficit present.     Mental Status: He is alert and oriented to person, place, and time.      ED Treatments / Results  Labs (all labs ordered are listed, but only abnormal results are displayed) Labs Reviewed  COMPREHENSIVE METABOLIC PANEL - Abnormal; Notable for the following components:      Result Value   Glucose, Bld 150 (*)    Total Protein 6.1 (*)    Alkaline Phosphatase 33 (*)    All other components within normal limits  CBC WITH DIFFERENTIAL/PLATELET  I-STAT TROPONIN, ED    EKG EKG Interpretation  Date/Time:  Tuesday March 18 2018 08:42:04 EST Ventricular Rate:  65 PR Interval:    QRS Duration: 101 QT Interval:  389 QTC Calculation: 405 R Axis:   61 Text Interpretation:  Sinus rhythm ST elevation suggests acute pericarditis Confirmed by Fredia Sorrow [phone]) on 03/18/2018 9:04:55 AM   Radiology Dg Chest 2 View  Result Date: 03/18/2018 CLINICAL DATA:  Shortness of breath, palpitations and diaphoresis beginning this morning. EXAM: CHEST - 2 VIEW COMPARISON:  10/23/2016 FINDINGS: The heart size and mediastinal contours are within normal limits. Both lungs are clear. The visualized skeletal structures are unremarkable. IMPRESSION: No active cardiopulmonary disease. Electronically Signed   By: Earle Gell M.D.   On: 03/18/2018 10:23    Procedures Procedures (including critical care time)  Medications Ordered in ED Medications  0.9 %  sodium chloride infusion (has no administration in time range)  sodium chloride 0.9 % bolus 500 mL (has no administration in time range)     Initial Impression / Assessment and Plan / ED Course  I have reviewed the triage vital signs and the nursing notes.  Pertinent labs & imaging results that were available during my care of the patient were reviewed by me and considered in my medical decision making (see chart for details).        Patient palpitations which are presumably were an SVT.  Does have a loop recorder.  Had resolved shortly prior to arrival.  However he had to take a total of 100 mg of his metoprolol between 3:30 in the morning and 7 in the morning and now he has a heart rate in the 40s and blood pressures in the low 90s to upper 30D on systolic.  He is asymptomatic contacted cardiology about the scenario feel that he probably needs admission and observation and less beta-blocker wears off.  Cardiology will come see the patient.  Patient's initial troponin was negative.  EKG without any acute changes.  Final Clinical Impressions(s) / ED Diagnoses   Final diagnoses:  Palpitations  Bradycardia  Hypotension, unspecified hypotension type    ED Discharge Orders    None       Fredia Sorrow, MD 03/18/18 1226

## 2018-03-18 NOTE — Discharge Instructions (Signed)
Call your cardiologist tomorrow.  Return to the ED for worsening symptoms.

## 2018-03-19 ENCOUNTER — Telehealth: Payer: Self-pay | Admitting: Internal Medicine

## 2018-03-19 NOTE — Telephone Encounter (Signed)
Patient is calling to schedule his ablation.  He also needs medical management pending the ablation for his SVT's

## 2018-03-19 NOTE — Telephone Encounter (Signed)
Returned call to Pt.  Pt will follow up tomorrow with Dr. Lovena Le to discuss ablation.

## 2018-03-19 NOTE — Telephone Encounter (Signed)
See office note 02/18/18 Dr.Taylor in regards to ablation.

## 2018-03-20 ENCOUNTER — Encounter: Payer: Self-pay | Admitting: Internal Medicine

## 2018-03-20 ENCOUNTER — Ambulatory Visit (INDEPENDENT_AMBULATORY_CARE_PROVIDER_SITE_OTHER): Payer: BLUE CROSS/BLUE SHIELD | Admitting: Internal Medicine

## 2018-03-20 VITALS — BP 128/78 | HR 64 | Ht 74.0 in | Wt 196.8 lb

## 2018-03-20 DIAGNOSIS — I471 Supraventricular tachycardia: Secondary | ICD-10-CM | POA: Diagnosis not present

## 2018-03-20 DIAGNOSIS — R002 Palpitations: Secondary | ICD-10-CM | POA: Diagnosis not present

## 2018-03-20 NOTE — Progress Notes (Signed)
HPI [name] returns today for followup of his SVT. He is a pleasant 63 yo man with palpitations who was found to have SVT. He had a recent episode where he went into SVT, took a couple of metoprolol and passed out. By the time he went to the ED he was feeling better. He had SVT on his cardiac monitor at  Allergies  Allergen Reactions  . Penicillins Other (See Comments)    Childhood allergy   Did it involve swelling of the face/tongue/throat, SOB, or low BP? Unknown Did it involve sudden or severe rash/hives, skin peeling, or any reaction on the inside of your mouth or nose? Unknown Did you need to seek medical attention at a hospital or doctor's office? Unknown When did it last happen? If all above answers are "NO", may proceed with cephalosporin use.      Current Outpatient Medications  Medication Sig Dispense Refill  . metoprolol tartrate (LOPRESSOR) 25 MG tablet Take 1 tablet by mouth as needed for palpitations 30 tablet 0   No current facility-administered medications for this visit.      Past Medical History:  Diagnosis Date  . Depression   . Fibromyalgia   . GERD (gastroesophageal reflux disease)    w/ LA Grade A erosive esophagitis  . Hiatal hernia   . IBS (irritable bowel syndrome)   . Internal hemorrhoids     ROS:   All systems reviewed and negative except as noted in the HPI.   Past Surgical History:  Procedure Laterality Date  . LAPAROSCOPIC NISSEN FUNDOPLICATION  00/9381  . LOOP RECORDER INSERTION N/A 10/26/2016   Procedure: LOOP RECORDER INSERTION;  Surgeon: Evans Lance, MD;  Location: [facility];  Service: Cardiovascular;  Laterality: N/A;     Family History  Problem Relation Age of Onset  . Breast cancer Mother   . Colon cancer Neg Hx      Social History   Socioeconomic History  . Marital status: Married    Spouse name: Not on file  . Number of children: 2  . Years of education: Not on file  . Highest education  level: Not on file  Occupational History  . Not on file  Social Needs  . Financial resource strain: Not on file  . Food insecurity:    Worry: Not on file    Inability: Not on file  . Transportation needs:    Medical: Not on file    Non-medical: Not on file  Tobacco Use  . Smoking status: Never Smoker  . Smokeless tobacco: Never Used  Substance and Sexual Activity  . Alcohol use: No  . Drug use: No  . Sexual activity: Not on file  Lifestyle  . Physical activity:    Days per week: Not on file    Minutes per session: Not on file  . Stress: Not on file  Relationships  . Social connections:    Talks on phone: Not on file    Gets together: Not on file    Attends religious service: Not on file    Active member of club or organization: Not on file    Attends meetings of clubs or organizations: Not on file    Relationship status: Not on file  . Intimate partner violence:    Fear of current or ex partner: Not on file    Emotionally abused: Not on file    Physically abused: Not on file    Forced sexual  activity: Not on file  Other Topics Concern  . Not on file  Social History Narrative  . Not on file     BP 128/78   Pulse 64   Ht 6\' 2"  (1.88 m)   Wt 196 lb 12.8 oz (89.3 kg)   SpO2 98%   BMI 25.27 kg/m   Physical Exam:  Well appearing NAD HEENT: Unremarkable Neck:  No JVD, no thyromegally Lymphatics:  No adenopathy Back:  No CVA tenderness Lungs:  Clear HEART:  Regular rate rhythm, no murmurs, no rubs, no clicks Abd:  soft, positive bowel sounds, no organomegally, no rebound, no guarding Ext:  2 plus pulses, no edema, no cyanosis, no clubbing Skin:  No rashes no nodules Neuro:  CN II through XII intact, motor grossly intact  DEVICE  Normal device function.  See PaceArt for details. Episodes of SVT up to 170/min  Assess/Plan: 1. SVT - I have discussed the indications/risks/benefits/goals/expectations of EP study and catheter ablation of SVT and he wishes to  proceed. 2. Syncope - his was due to taking the beta blocker in the setting of SVT with likely vagal episode I spent over 25 minutes including 50% face to face time with the patient producing the note.  Mikle Bosworth.D.

## 2018-03-20 NOTE — H&P (View-Only) (Signed)
HPI [name] returns today for followup of his SVT. He is a pleasant 63 yo man with palpitations who was found to have SVT. He had a recent episode where he went into SVT, took a couple of metoprolol and passed out. By the time he went to the ED he was feeling better. He had SVT on his cardiac monitor at  Allergies  Allergen Reactions  . Penicillins Other (See Comments)    Childhood allergy   Did it involve swelling of the face/tongue/throat, SOB, or low BP? Unknown Did it involve sudden or severe rash/hives, skin peeling, or any reaction on the inside of your mouth or nose? Unknown Did you need to seek medical attention at a hospital or doctor's office? Unknown When did it last happen? If all above answers are "NO", may proceed with cephalosporin use.      Current Outpatient Medications  Medication Sig Dispense Refill  . metoprolol tartrate (LOPRESSOR) 25 MG tablet Take 1 tablet by mouth as needed for palpitations 30 tablet 0   No current facility-administered medications for this visit.      Past Medical History:  Diagnosis Date  . Depression   . Fibromyalgia   . GERD (gastroesophageal reflux disease)    w/ LA Grade A erosive esophagitis  . Hiatal hernia   . IBS (irritable bowel syndrome)   . Internal hemorrhoids     ROS:   All systems reviewed and negative except as noted in the HPI.   Past Surgical History:  Procedure Laterality Date  . LAPAROSCOPIC NISSEN FUNDOPLICATION  87/5643  . LOOP RECORDER INSERTION N/A 10/26/2016   Procedure: LOOP RECORDER INSERTION;  Surgeon: Evans Lance, MD;  Location: [facility];  Service: Cardiovascular;  Laterality: N/A;     Family History  Problem Relation Age of Onset  . Breast cancer Mother   . Colon cancer Neg Hx      Social History   Socioeconomic History  . Marital status: Married    Spouse name: Not on file  . Number of children: 2  . Years of education: Not on file  . Highest education  level: Not on file  Occupational History  . Not on file  Social Needs  . Financial resource strain: Not on file  . Food insecurity:    Worry: Not on file    Inability: Not on file  . Transportation needs:    Medical: Not on file    Non-medical: Not on file  Tobacco Use  . Smoking status: Never Smoker  . Smokeless tobacco: Never Used  Substance and Sexual Activity  . Alcohol use: No  . Drug use: No  . Sexual activity: Not on file  Lifestyle  . Physical activity:    Days per week: Not on file    Minutes per session: Not on file  . Stress: Not on file  Relationships  . Social connections:    Talks on phone: Not on file    Gets together: Not on file    Attends religious service: Not on file    Active member of club or organization: Not on file    Attends meetings of clubs or organizations: Not on file    Relationship status: Not on file  . Intimate partner violence:    Fear of current or ex partner: Not on file    Emotionally abused: Not on file    Physically abused: Not on file    Forced sexual  activity: Not on file  Other Topics Concern  . Not on file  Social History Narrative  . Not on file     BP 128/78   Pulse 64   Ht 6\' 2"  (1.88 m)   Wt 196 lb 12.8 oz (89.3 kg)   SpO2 98%   BMI 25.27 kg/m   Physical Exam:  Well appearing NAD HEENT: Unremarkable Neck:  No JVD, no thyromegally Lymphatics:  No adenopathy Back:  No CVA tenderness Lungs:  Clear HEART:  Regular rate rhythm, no murmurs, no rubs, no clicks Abd:  soft, positive bowel sounds, no organomegally, no rebound, no guarding Ext:  2 plus pulses, no edema, no cyanosis, no clubbing Skin:  No rashes no nodules Neuro:  CN II through XII intact, motor grossly intact  DEVICE  Normal device function.  See PaceArt for details. Episodes of SVT up to 170/min  Assess/Plan: 1. SVT - I have discussed the indications/risks/benefits/goals/expectations of EP study and catheter ablation of SVT and he wishes to  proceed. 2. Syncope - his was due to taking the beta blocker in the setting of SVT with likely vagal episode I spent over 25 minutes including 50% face to face time with the patient producing the note.  Mikle Bosworth.D.

## 2018-03-20 NOTE — Patient Instructions (Addendum)
Medication Instructions:  Your physician recommends that you continue on your current medications as directed. Please refer to the Current Medication list given to you today.  Labwork: None ordered.  Testing/Procedures: Your physician has recommended that you have an ablation. Catheter ablation is a medical procedure used to treat some cardiac arrhythmias (irregular heartbeats). During catheter ablation, a long, thin, flexible tube is put into a blood vessel in your groin (upper thigh), or neck. This tube is called an ablation catheter. It is then guided to your heart through the blood vessel. Radio frequency waves destroy small areas of heart tissue where abnormal heartbeats may cause an arrhythmia to start. Please see the instruction sheet given to you today.  Follow-Up:  You will follow up with Dr. Lovena Conner 4 weeks after your procedure.    SVT ablation:  Please arrive to ADMITTING down the hall from the [facility] main entrance of [facility] at:  10:00 am on March 28, 2018  Do not eat or drink after midnight prior to procedure  Do not take any medications the morning of the procedure-do not take your metoprolol within 2 days of your procedure  Plan for one night stay  You will need someone to drive you home at discharge  If you need a refill on your cardiac medications before your next appointment, please call your pharmacy.   Cardiac Ablation Cardiac ablation is a procedure to disable (ablate) a small amount of heart tissue in very specific places. The heart has many electrical connections. Sometimes these connections are abnormal and can cause the heart to beat very fast or irregularly. Ablating some of the problem areas can improve the heart rhythm or return it to normal. Ablation may be done for people who:  Have Wolff-Parkinson-White syndrome.  Have fast heart rhythms (tachycardia).  Have taken medicines for an abnormal heart rhythm (arrhythmia) that were not  effective or caused side effects.  Have a high-risk heartbeat that may be life-threatening. During the procedure, a small incision is made in the neck or the groin, and a long, thin, flexible tube (catheter) is inserted into the incision and moved to the heart. Small devices (electrodes) on the tip of the catheter will send out electrical currents. A type of X-ray (fluoroscopy) will be used to help guide the catheter and to provide images of the heart. Tell a health care provider about:  Any allergies you have.  All medicines you are taking, including vitamins, herbs, eye drops, creams, and over-the-counter medicines.  Any problems you or family members have had with anesthetic medicines.  Any blood disorders you have.  Any surgeries you have had.  Any medical conditions you have, such as kidney failure.  Whether you are pregnant or may be pregnant. What are the risks? Generally, this is a safe procedure. However, problems may occur, including:  Infection.  Bruising and bleeding at the catheter insertion site.  Bleeding into the chest, especially into the sac that surrounds the heart. This is a serious complication.  Stroke or blood clots.  Damage to other structures or organs.  Allergic reaction to medicines or dyes.  Need for a permanent pacemaker if the normal electrical system is damaged. A pacemaker is a small computer that sends electrical signals to the heart and helps your heart beat normally.  The procedure not being fully effective. This may not be recognized until months later. Repeat ablation procedures are sometimes required. What happens before the procedure?  Follow instructions from your health  care provider about eating or drinking restrictions.  Ask your health care provider about: ? Changing or stopping your regular medicines. This is especially important if you are taking diabetes medicines or blood thinners. ? Taking medicines such as aspirin and  ibuprofen. These medicines can thin your blood. Do not take these medicines before your procedure if your health care provider instructs you not to.  Plan to have someone take you home from the hospital or clinic.  If you will be going home right after the procedure, plan to have someone with you for 24 hours. What happens during the procedure?  To lower your risk of infection: ? Your health care team will wash or sanitize their hands. ? Your skin will be washed with soap. ? Hair may be removed from the incision area.  An IV tube will be inserted into one of your veins.  You will be given a medicine to help you relax (sedative).  The skin on your neck or groin will be numbed.  An incision will be made in your neck or your groin.  A needle will be inserted through the incision and into a large vein in your neck or groin.  A catheter will be inserted into the needle and moved to your heart.  Dye may be injected through the catheter to help your surgeon see the area of the heart that needs treatment.  Electrical currents will be sent from the catheter to ablate heart tissue in desired areas. There are three types of energy that may be used to ablate heart tissue: ? Heat (radiofrequency energy). ? Laser energy. ? Extreme cold (cryoablation).  When the necessary tissue has been ablated, the catheter will be removed.  Pressure will be held on the catheter insertion area to prevent excessive bleeding.  A bandage (dressing) will be placed over the catheter insertion area. The procedure may vary among health care providers and hospitals. What happens after the procedure?  Your blood pressure, heart rate, breathing rate, and blood oxygen level will be monitored until the medicines you were given have worn off.  Your catheter insertion area will be monitored for bleeding. You will need to lie still for a few hours to ensure that you do not bleed from the catheter insertion area.  Do  not drive for 24 hours or as long as directed by your health care provider. Summary  Cardiac ablation is a procedure to disable (ablate) a small amount of heart tissue in very specific places. Ablating some of the problem areas can improve the heart rhythm or return it to normal.  During the procedure, electrical currents will be sent from the catheter to ablate heart tissue in desired areas. This information is not intended to replace advice given to you by your health care provider. Make sure you discuss any questions you have with your health care provider. Document Released: 05/27/2008 Document Revised: 11/28/2015 Document Reviewed: 11/28/2015 Elsevier Interactive Patient Education  2019 [employer].

## 2018-03-20 NOTE — Addendum Note (Signed)
Addended by: Evans Lance on: 03/20/2018 01:30 PM   Modules accepted: Level of Service

## 2018-03-26 ENCOUNTER — Ambulatory Visit (INDEPENDENT_AMBULATORY_CARE_PROVIDER_SITE_OTHER): Payer: BLUE CROSS/BLUE SHIELD | Admitting: *Deleted

## 2018-03-26 DIAGNOSIS — R002 Palpitations: Secondary | ICD-10-CM | POA: Diagnosis not present

## 2018-03-27 LAB — CUP PACEART REMOTE DEVICE CHECK
Date Time Interrogation Session: 20200305023923
Implantable Pulse Generator Implant Date: 20181005

## 2018-03-28 ENCOUNTER — Encounter (HOSPITAL_COMMUNITY)
Admission: RE | Disposition: A | Discharge status: Home / Self Care | Payer: Self-pay | Source: Home / Self Care | Attending: Internal Medicine

## 2018-03-28 ENCOUNTER — Other Ambulatory Visit: Payer: Self-pay

## 2018-03-28 ENCOUNTER — Ambulatory Visit (HOSPITAL_COMMUNITY)
Admission: RE | Admit: 2018-03-28 | Discharge: 2018-03-28 | Disposition: A | Discharge status: Home / Self Care | Payer: BLUE CROSS/BLUE SHIELD | Attending: Internal Medicine | Admitting: Internal Medicine

## 2018-03-28 DIAGNOSIS — I471 Supraventricular tachycardia, unspecified: Secondary | ICD-10-CM | POA: Diagnosis not present

## 2018-03-28 DIAGNOSIS — Z79899 Other long term (current) drug therapy: Secondary | ICD-10-CM | POA: Insufficient documentation

## 2018-03-28 DIAGNOSIS — K589 Irritable bowel syndrome without diarrhea: Secondary | ICD-10-CM | POA: Insufficient documentation

## 2018-03-28 DIAGNOSIS — M797 Fibromyalgia: Secondary | ICD-10-CM | POA: Insufficient documentation

## 2018-03-28 DIAGNOSIS — Z880 Allergy status to penicillin: Secondary | ICD-10-CM | POA: Insufficient documentation

## 2018-03-28 HISTORY — PX: SVT ABLATION: EP1225

## 2018-03-28 SURGERY — SVT ABLATION
Anesthesia: LOCAL

## 2018-03-28 MED ORDER — FENTANYL CITRATE (PF) 100 MCG/2ML IJ SOLN
INTRAMUSCULAR | Status: AC
  Filled 2018-03-28: qty 2

## 2018-03-28 MED ORDER — MIDAZOLAM HCL 5 MG/5ML IJ SOLN
INTRAMUSCULAR | Status: AC
  Filled 2018-03-28: qty 5

## 2018-03-28 MED ORDER — SODIUM CHLORIDE 0.9% FLUSH: 3.0000 mL | INTRAVENOUS | Status: DC | PRN

## 2018-03-28 MED ORDER — SODIUM CHLORIDE 0.9 % IV SOLN: 250.0000 mL | INTRAVENOUS | Status: DC | PRN

## 2018-03-28 MED ORDER — MIDAZOLAM HCL 5 MG/5ML IJ SOLN
INTRAMUSCULAR | Status: DC | PRN
  Administered 2018-03-28 (×2): 2 mg via INTRAVENOUS
  Administered 2018-03-28: 1 mg via INTRAVENOUS
  Administered 2018-03-28: 2 mg via INTRAVENOUS
  Administered 2018-03-28: 1 mg via INTRAVENOUS

## 2018-03-28 MED ORDER — BUPIVACAINE HCL (PF) 0.25 % IJ SOLN
INTRAMUSCULAR | Status: AC
  Filled 2018-03-28: qty 60

## 2018-03-28 MED ORDER — HEPARIN (PORCINE) IN NACL 1000-0.9 UT/500ML-% IV SOLN
INTRAVENOUS | Status: AC
  Filled 2018-03-28: qty 500

## 2018-03-28 MED ORDER — FENTANYL CITRATE (PF) 100 MCG/2ML IJ SOLN
INTRAMUSCULAR | Status: DC | PRN
  Administered 2018-03-28: 25 ug via INTRAVENOUS
  Administered 2018-03-28 (×2): 12.5 ug via INTRAVENOUS
  Administered 2018-03-28 (×2): 25 ug via INTRAVENOUS

## 2018-03-28 MED ORDER — SODIUM CHLORIDE 0.9 % IV SOLN
INTRAVENOUS | Status: DC
  Administered 2018-03-28: 11:00:00 via INTRAVENOUS

## 2018-03-28 MED ORDER — HEPARIN (PORCINE) IN NACL 2-0.9 UNITS/ML
INTRAMUSCULAR | Status: AC | PRN
  Administered 2018-03-28: 500 mL

## 2018-03-28 MED ORDER — ACETAMINOPHEN 325 MG PO TABS
650.0000 mg | ORAL_TABLET | ORAL | Status: DC | PRN
  Filled 2018-03-28: qty 2

## 2018-03-28 MED ORDER — BUPIVACAINE HCL (PF) 0.25 % IJ SOLN
INTRAMUSCULAR | Status: DC | PRN
  Administered 2018-03-28: 60 mL

## 2018-03-28 MED ORDER — ONDANSETRON HCL 4 MG/2ML IJ SOLN: 4.0000 mg | Freq: Four times a day (QID) | INTRAMUSCULAR | Status: DC | PRN

## 2018-03-28 MED ORDER — SODIUM CHLORIDE 0.9% FLUSH: 3.0000 mL | Freq: Two times a day (BID) | INTRAVENOUS | Status: DC

## 2018-03-28 SURGICAL SUPPLY — 11 items
CATH EZ STEER NAV 4MM D-F CUR (ABLATOR) ×2 IMPLANT
CATH HEX JOSEPH 2-5-2 65CM 6F (CATHETERS) ×2 IMPLANT
CATH JOSEPH QUAD ALLRED 6F REP (CATHETERS) ×2 IMPLANT
CATH JOSEPHSON QUAD-ALLRED 6FR (CATHETERS) ×2 IMPLANT
PACK EP LATEX FREE (CUSTOM PROCEDURE TRAY) ×1
PACK EP LF (CUSTOM PROCEDURE TRAY) ×1 IMPLANT
PAD PRO RADIOLUCENT 2001M-C (PAD) ×2 IMPLANT
PATCH CARTO3 (PAD) ×2 IMPLANT
SHEATH PINNACLE 6F 10CM (SHEATH) ×4 IMPLANT
SHEATH PINNACLE 7F 10CM (SHEATH) ×2 IMPLANT
SHEATH PINNACLE 8F 10CM (SHEATH) ×2 IMPLANT

## 2018-03-28 NOTE — Progress Notes (Signed)
31fr sheath aspirated and removed from right IJ, manual pressure applied for 10 minutes. Site level 0 Tegaderm dressing applied.  Two  28fr sheaths and one 10fr sheath aspirated and removed from rfv, manual pressure applied for 20 minutes. Site level 0 no s+s of hematoma.  Tegaderm dressing applied, bedrest instructions given.  Bilateral dp and pt pulses palpable.  Bedrest begins at 14:40:00

## 2018-03-28 NOTE — Interval H&P Note (Signed)
History and Physical Interval Note:  03/28/2018 11:29 AM  Calvin Conner  has presented today for surgery, with the diagnosis of svt  The various methods of treatment have been discussed with the patient and family. After consideration of risks, benefits and other options for treatment, the patient has consented to  Procedure(s): SVT ABLATION (N/A) as a surgical intervention .  The patient's history has been reviewed, patient examined, no change in status, stable for surgery.  I have reviewed the patient's chart and labs.  Questions were answered to the patient's satisfaction.     Cristopher Peru

## 2018-03-28 NOTE — Discharge Instructions (Signed)

## 2018-03-28 NOTE — Progress Notes (Signed)
No bleeding or swelling noted after ambulation 

## 2018-03-31 ENCOUNTER — Encounter (HOSPITAL_COMMUNITY): Payer: Self-pay | Admitting: Internal Medicine

## 2018-04-02 NOTE — Progress Notes (Signed)
Carelink Summary Report / Loop Recorder 

## 2018-04-24 ENCOUNTER — Ambulatory Visit: Payer: BLUE CROSS/BLUE SHIELD | Admitting: Internal Medicine

## 2018-04-28 ENCOUNTER — Other Ambulatory Visit: Payer: Self-pay

## 2018-04-28 ENCOUNTER — Ambulatory Visit (INDEPENDENT_AMBULATORY_CARE_PROVIDER_SITE_OTHER): Payer: BLUE CROSS/BLUE SHIELD | Admitting: *Deleted

## 2018-04-28 DIAGNOSIS — R002 Palpitations: Secondary | ICD-10-CM

## 2018-04-29 LAB — CUP PACEART REMOTE DEVICE CHECK
Date Time Interrogation Session: 20200407033940
Implantable Pulse Generator Implant Date: 20181005

## 2018-05-06 NOTE — Progress Notes (Signed)
Carelink Summary Report / Loop Recorder 

## 2018-06-02 ENCOUNTER — Other Ambulatory Visit: Payer: Self-pay

## 2018-06-02 ENCOUNTER — Ambulatory Visit (INDEPENDENT_AMBULATORY_CARE_PROVIDER_SITE_OTHER): Payer: BLUE CROSS/BLUE SHIELD | Admitting: *Deleted

## 2018-06-02 DIAGNOSIS — R002 Palpitations: Secondary | ICD-10-CM

## 2018-06-02 LAB — CUP PACEART REMOTE DEVICE CHECK
Date Time Interrogation Session: 20200510094204
Implantable Pulse Generator Implant Date: 20181005

## 2018-06-10 NOTE — Progress Notes (Signed)
Carelink Summary Report / Loop Recorder 

## 2018-06-27 ENCOUNTER — Encounter: Payer: Self-pay | Admitting: Gastroenterology

## 2018-07-04 ENCOUNTER — Ambulatory Visit (INDEPENDENT_AMBULATORY_CARE_PROVIDER_SITE_OTHER): Payer: BC Managed Care – PPO | Admitting: *Deleted

## 2018-07-04 DIAGNOSIS — R002 Palpitations: Secondary | ICD-10-CM | POA: Diagnosis not present

## 2018-07-04 LAB — CUP PACEART REMOTE DEVICE CHECK
Date Time Interrogation Session: 20200612100921
Implantable Pulse Generator Implant Date: 20181005

## 2018-07-07 NOTE — Progress Notes (Signed)
Carelink Summary Report / Loop Recorder 

## 2018-07-22 ENCOUNTER — Ambulatory Visit: Payer: BC Managed Care – PPO | Admitting: Gastroenterology

## 2018-08-02 ENCOUNTER — Encounter: Payer: Self-pay | Admitting: Gastroenterology

## 2018-08-06 ENCOUNTER — Encounter: Payer: BLUE CROSS/BLUE SHIELD | Admitting: Gastroenterology

## 2018-08-06 ENCOUNTER — Ambulatory Visit (INDEPENDENT_AMBULATORY_CARE_PROVIDER_SITE_OTHER): Payer: BC Managed Care – PPO | Admitting: *Deleted

## 2018-08-06 DIAGNOSIS — R002 Palpitations: Secondary | ICD-10-CM

## 2018-08-06 LAB — CUP PACEART REMOTE DEVICE CHECK
Date Time Interrogation Session: 20200715141121
Implantable Pulse Generator Implant Date: 20181005

## 2018-08-08 ENCOUNTER — Encounter: Payer: Self-pay | Admitting: Nurse Practitioner

## 2018-08-15 NOTE — Progress Notes (Signed)
Carelink Summary Report / Loop Recorder 

## 2018-08-26 ENCOUNTER — Encounter: Payer: Self-pay | Admitting: Nurse Practitioner

## 2018-08-26 ENCOUNTER — Ambulatory Visit (INDEPENDENT_AMBULATORY_CARE_PROVIDER_SITE_OTHER): Payer: Medicare Other | Admitting: Nurse Practitioner

## 2018-08-26 VITALS — BP 120/70 | HR 76 | Temp 98.0°F | Wt 192.2 lb

## 2018-08-26 DIAGNOSIS — Z1211 Encounter for screening for malignant neoplasm of colon: Secondary | ICD-10-CM | POA: Diagnosis not present

## 2018-08-26 DIAGNOSIS — K219 Gastro-esophageal reflux disease without esophagitis: Secondary | ICD-10-CM | POA: Diagnosis not present

## 2018-08-26 MED ORDER — NA SULFATE-K SULFATE-MG SULF 17.5-3.13-1.6 GM/177ML PO SOLN: ORAL | 0 refills | Status: DC

## 2018-08-26 NOTE — Patient Instructions (Signed)
If you are age 65 or older, your body mass index should be between 23-30. Your Body mass index is 24.68 kg/m. If this is out of the aforementioned range listed, please consider follow up with your Primary Care Provider.  If you are age 65 or younger, your body mass index should be between 19-25. Your Body mass index is 24.68 kg/m. If this is out of the aformentioned range listed, please consider follow up with your Primary Care Provider.   You have been scheduled for an endoscopy and colonoscopy. Please follow the written instructions given to you at your visit today. Please pick up your prep supplies at the pharmacy within the next 1-3 days. If you use inhalers (even only as needed), please bring them with you on the day of your procedure. Your physician has requested that you go to www.startemmi.com and enter the access code given to you at your visit today. This web site gives a general overview about your procedure. However, you should still follow specific instructions given to you by our office regarding your preparation for the procedure.  We have sent the following medications to your pharmacy for you to pick up at your convenience: Suprep  Thank you for choosing me and [facility].   Tye Savoy, NP

## 2018-08-26 NOTE — Progress Notes (Signed)
Chief Complaint:    Colon cancer screening and GERD  IMPRESSION and PLAN:    65. 63 yo male due for colon cancer screening, last colonoscopy 10 years ago.  -The risks and benefits of colonoscopy with possible polypectomy / biopsies were discussed and the patient agrees to proceed.   2. GERD  / remote fundoplication.  Asymptomatic for years off PPI, now with 6 weeks of constant burning in epigastrium / lower chest and regurgitation.  Completed triple therapy for H. pylori several weeks ago (apparently tested positive a year ago but was not treated).  Continued Nexium for an additional 10 days and absolutely no improvement in his symptoms. -For refractory GERD will arrange for EGD to be done at time of colonoscopy. The risks and benefits of EGD were discussed and the patient agrees to proceed. -Intolerant to most PPIs, causes cognitive changes ("fuzzyheaded") -Suggested trial of Pepcid, he would like to hold off on starting anything before EGD  3. SVT, s/p ablation in March.     HPI:     Patient is a 63 year old male with history of chronic anxiety, depression, fibromyalgia, IBS, GERD, SVT status post ablation 2020 . He is known remotely to Dr. Fuller Plan for history of GERD and for colon cancer screenings.  It is time for patient's colonoscopy but he came in today to talk about recurrent GERD symptoms.  He is status post remote fundoplication, did well off treatment for many years until just 6 weeks ago.  Now with regurgitation and almost constant burning in the epigastrium and lower chest.  Burning not exacerbated by eating.  Patient said he saw PCP when the burning started.  She had apparently tested him for H. pylori a year or so ago and it was positive but no treatment prescribed at the time.  Given these new symptoms his PCP prescribed triple therapy with Biaxin, amoxicillin and Nexium.  He completed 14 days of treatment and then took Nexium for at least another 10 days.  His symptoms  have not improved at all with H. pylori treatment or PPI.  He has not tried anything else for the discomfort such as Tums or Gaviscon.  Since the Nexium provided no benefit he stopped it.  Patient says he does not tolerate Nexium, Prilosec or AcipHex as it causes him to be "fuzzyheaded".  He took Zantac for years and it helped "more or less" .  Nexium did help but just would not take it because of the cognitive disturbances.  No dysphagia. He inquires about EGD to evaluate Nissen and asks for referral to [facility] if needs a redo. No other GI complaints. BMs normal, no blood in stool.   Data Reviewed:  Hgb 14.6 in Feb 2020.   Review of systems:     Positive for anxiety, back pain, depression, fatigue, heart changes and sleeping problems. All other systems reviewed and negative except where noted in HPI  Past Medical History:  Diagnosis Date   Depression    Fibromyalgia    GERD (gastroesophageal reflux disease)    w/ LA Grade A erosive esophagitis   Hiatal hernia    IBS (irritable bowel syndrome)    Internal hemorrhoids    Past Surgical History:  Procedure Laterality Date   LAPAROSCOPIC NISSEN FUNDOPLICATION  93/9030   LOOP RECORDER INSERTION N/A 10/26/2016   Procedure: LOOP RECORDER INSERTION;  Surgeon: Evans Lance, MD;  Location: [facility];  Service: Cardiovascular;  Laterality: N/A;  SVT ABLATION N/A 03/28/2018   Procedure: SVT ABLATION;  Surgeon: Evans Lance, MD;  Location: [facility];  Service: Cardiovascular;  Laterality: N/A;   Family History  Problem Relation Age of Onset   Breast cancer Mother    Colon cancer Neg Hx    Esophageal cancer Neg Hx    Pancreatic cancer Neg Hx    Stomach cancer Neg Hx    Liver disease Neg Hx     Current Outpatient Medications  Medication Sig Dispense Refill   Alpha-Lipoic Acid 600 MG CAPS Take 2 capsules by mouth daily.     AMBULATORY NON FORMULARY MEDICATION Medication Name: iodine 12.5 mg capsule 1 capsule  po daily     AMBULATORY NON FORMULARY MEDICATION Medication Name: phosphatidylcholine 420 mg- Take 2 capsules by mouth daily     AMBULATORY NON FORMULARY MEDICATION Medication Name:Quercitin 500 mg- Take 2 capsules by mouth daily     AMBULATORY NON FORMULARY MEDICATION Medication Name: Interfase Enzyme Blend 175 mg- Take 2 capsules by mouth daily     Ascorbic Acid (VITAMIN C) 1000 MG tablet Take 1,000 mg by mouth daily.     clonazePAM (KLONOPIN) 1 MG tablet Take 1 mg by mouth 3 (three) times daily as needed for anxiety.     fluconazole (DIFLUCAN) 200 MG tablet Take 200 mg by mouth daily.     GLUTATHIONE PO Take 2 capsules by mouth daily. (175 mg)     HM MAGNESIUM CITRATE PO Take by mouth as needed (constipation).     pyridOXINE (VITAMIN B-6) 50 MG tablet Take 100 mg by mouth daily.     Resveratrol 250 MG CAPS Take 2 capsules by mouth daily.     TURMERIC PO Take 2 capsules by mouth daily. (420 mg)     zolpidem (AMBIEN) 10 MG tablet Take 10 mg by mouth at bedtime.     No current facility-administered medications for this visit.     Physical Exam:     Temp 98 F (36.7 C)    Wt 192 lb 3.2 oz (87.2 kg)    BMI 24.68 kg/m   GENERAL:  Well developed male in NAD PSYCH: : Cooperative, normal affect EENT:  conjunctiva pink, mucous membranes moist, neck supple without masses CARDIAC:  RRR, , no peripheral edema PULM: Normal respiratory effort, lungs CTA bilaterally, no wheezing ABDOMEN:  Nondistended, soft, nontender. No obvious masses, no hepatomegaly,  normal bowel sounds SKIN:  turgor, no lesions seen Musculoskeletal:  Normal muscle tone, normal strength NEURO: Alert and oriented x 3, no focal neurologic deficits   Tye Savoy , NP 08/26/2018, 1:47 PM

## 2018-08-27 NOTE — Progress Notes (Signed)
Reviewed and agree with management plan.  Tyreshia Ingman T. Wylma Tatem, MD FACG 

## 2018-09-01 ENCOUNTER — Telehealth: Payer: Self-pay | Admitting: Gastroenterology

## 2018-09-01 NOTE — Telephone Encounter (Signed)

## 2018-09-02 ENCOUNTER — Ambulatory Visit (AMBULATORY_SURGERY_CENTER): Payer: Medicare Other | Admitting: Gastroenterology

## 2018-09-02 ENCOUNTER — Other Ambulatory Visit: Payer: Self-pay

## 2018-09-02 ENCOUNTER — Encounter: Payer: Self-pay | Admitting: Gastroenterology

## 2018-09-02 VITALS — BP 100/70 | HR 64 | Temp 99.0°F | Resp 14 | Ht 74.0 in | Wt 192.0 lb

## 2018-09-02 DIAGNOSIS — Z1211 Encounter for screening for malignant neoplasm of colon: Secondary | ICD-10-CM

## 2018-09-02 DIAGNOSIS — K635 Polyp of colon: Secondary | ICD-10-CM

## 2018-09-02 DIAGNOSIS — K209 Esophagitis, unspecified without bleeding: Secondary | ICD-10-CM

## 2018-09-02 DIAGNOSIS — K219 Gastro-esophageal reflux disease without esophagitis: Secondary | ICD-10-CM | POA: Diagnosis not present

## 2018-09-02 DIAGNOSIS — K649 Unspecified hemorrhoids: Secondary | ICD-10-CM

## 2018-09-02 DIAGNOSIS — D123 Benign neoplasm of transverse colon: Secondary | ICD-10-CM

## 2018-09-02 DIAGNOSIS — K2970 Gastritis, unspecified, without bleeding: Secondary | ICD-10-CM | POA: Diagnosis not present

## 2018-09-02 DIAGNOSIS — K629 Disease of anus and rectum, unspecified: Secondary | ICD-10-CM

## 2018-09-02 DIAGNOSIS — K319 Disease of stomach and duodenum, unspecified: Secondary | ICD-10-CM

## 2018-09-02 MED ORDER — SODIUM CHLORIDE 0.9 % IV SOLN: 500.0000 mL | Freq: Once | INTRAVENOUS | Status: DC

## 2018-09-02 MED ORDER — PANTOPRAZOLE SODIUM 40 MG PO TBEC: DELAYED_RELEASE_TABLET | ORAL | 4 refills | Status: DC

## 2018-09-02 MED ORDER — FAMOTIDINE 40 MG PO TABS: 40.0000 mg | ORAL_TABLET | Freq: Two times a day (BID) | ORAL | 3 refills | Status: DC

## 2018-09-02 NOTE — Progress Notes (Signed)
Called to room to assist during endoscopic procedure.  Patient ID and intended procedure confirmed with present staff. Received instructions for my participation in the procedure from the performing physician.  

## 2018-09-02 NOTE — Telephone Encounter (Signed)
Pt returned call and "No" to all of the Covid-19 screening questions

## 2018-09-02 NOTE — Progress Notes (Signed)
Report to PACU, RN, vss, BBS= Clear.  

## 2018-09-02 NOTE — Patient Instructions (Signed)
Please read handouts provided. Continue present medications. Await pathology results. Follow antireflux measures. Return to GI clinic in 2 months. Famotidine 40 mg twice daily.      YOU HAD AN ENDOSCOPIC PROCEDURE TODAY AT [facility]:   Refer to the procedure report that was given to you for any specific questions about what was found during the examination.  If the procedure report does not answer your questions, please call your gastroenterologist to clarify.  If you requested that your care partner not be given the details of your procedure findings, then the procedure report has been included in a sealed envelope for you to review at your convenience later.  YOU SHOULD EXPECT: Some feelings of bloating in the abdomen. Passage of more gas than usual.  Walking can help get rid of the air that was put into your GI tract during the procedure and reduce the bloating. If you had a lower endoscopy (such as a colonoscopy or flexible sigmoidoscopy) you may notice spotting of blood in your stool or on the toilet paper. If you underwent a bowel prep for your procedure, you may not have a normal bowel movement for a few days.  Please Note:  You might notice some irritation and congestion in your nose or some drainage.  This is from the oxygen used during your procedure.  There is no need for concern and it should clear up in a day or so.  SYMPTOMS TO REPORT IMMEDIATELY:   Following lower endoscopy (colonoscopy or flexible sigmoidoscopy):  Excessive amounts of blood in the stool  Significant tenderness or worsening of abdominal pains  Swelling of the abdomen that is new, acute  Fever of 100F or higher   Following upper endoscopy (EGD)  Vomiting of blood or coffee ground material  New chest pain or pain under the shoulder blades  Painful or persistently difficult swallowing  New shortness of breath  Fever of 100F or higher  Black, tarry-looking stools  For urgent or  emergent issues, a gastroenterologist can be reached at any hour by calling [phone].   DIET:  We do recommend a small meal at first, but then you may proceed to your regular diet.  Drink plenty of fluids but you should avoid alcoholic beverages for 24 hours.  ACTIVITY:  You should plan to take it easy for the rest of today and you should NOT DRIVE or use heavy machinery until tomorrow (because of the sedation medicines used during the test).    FOLLOW UP: Our staff will call the number listed on your records 48-72 hours following your procedure to check on you and address any questions or concerns that you may have regarding the information given to you following your procedure. If we do not reach you, we will leave a message.  We will attempt to reach you two times.  During this call, we will ask if you have developed any symptoms of COVID 19. If you develop any symptoms (ie: fever, flu-like symptoms, shortness of breath, cough etc.) before then, please call [phone].  If you test positive for Covid 19 in the 2 weeks post procedure, please call and report this information to [place].    If any biopsies were taken you will be contacted by phone or by letter within the next 1-3 weeks.  Please call us at [phone] if you have not heard about the biopsies in 3 weeks.    SIGNATURES/CONFIDENTIALITY: You and/or your care partner have signed paperwork which  will be entered into your electronic medical record.  These signatures attest to the fact that that the information above on your After Visit Summary has been reviewed and is understood.  Full responsibility of the confidentiality of this discharge information lies with you and/or your care-partner.

## 2018-09-02 NOTE — Op Note (Addendum)
Calvin Conner Procedure Date: 09/02/2018 2:23 PM MRN: [id number] Endoscopist: Ladene Artist , MD Age: 63 Referring MD:  Date of Birth: 12-26-1955 Gender: Male Account #: [id number] Procedure:                Upper GI endoscopy Indications:              Screening for Barrett's esophagus, Gastroesophageal                            reflux disease Medicines:                Monitored Anesthesia Care Procedure:                Pre-Anesthesia Assessment:                           - Prior to the procedure, a History and Physical                            was performed, and patient medications and                            allergies were reviewed. The patient's tolerance of                            previous anesthesia was also reviewed. The risks                            and benefits of the procedure and the sedation                            options and risks were discussed with the patient.                            All questions were answered, and informed consent                            was obtained. Prior Anticoagulants: The patient has                            taken no previous anticoagulant or antiplatelet                            agents. ASA Grade Assessment: II - A patient with                            mild systemic disease. After reviewing the risks                            and benefits, the patient was deemed in                            satisfactory condition to undergo the procedure.  After obtaining informed consent, the endoscope was                            passed under direct vision. Throughout the                            procedure, the patient's blood pressure, pulse, and                            oxygen saturations were monitored continuously. The                            Endoscope was introduced through the mouth, and                            advanced to the second part of duodenum. The  upper                            GI endoscopy was accomplished without difficulty.                            The patient tolerated the procedure well. Scope In: Scope Out: Findings:                 LA Grade A (one or more mucosal breaks less than 5                            mm, not extending between tops of 2 mucosal folds)                            esophagitis with no bleeding was found at the                            gastroesophageal junction.                           The exam of the esophagus was otherwise normal.                           Patchy mild inflammation characterized by erosions,                            erythema and granularity was found in the gastric                            fundus and in the gastric body. Biopsies were taken                            with a cold forceps for histology.                           The exam of the stomach was otherwise normal.  Evidence of a fundoplication was found in the                            cardia and in the gastric fundus. The wrap appeared                            a little loose. This was traversed.                           The duodenal bulb and second portion of the                            duodenum were normal. Complications:            No immediate complications. Estimated Blood Loss:     Estimated blood loss was minimal. Impression:               - LA Grade A reflux esophagitis.                           - Gastritis. Biopsied.                           - Evidence of prior fundoplication.                           - Normal duodenal bulb and second portion of the                            duodenum. Recommendation:           - Patient has a contact number available for                            emergencies. The signs and symptoms of potential                            delayed complications were discussed with the                            patient. Return to normal activities tomorrow.                             Written discharge instructions were provided to the                            patient.                           - Resume previous diet.                           - Follow antireflux measures.                           - Continue present medications.                           -  Await pathology results.                           - Famotidine 40 mg PO BID, refills for 1 year                            (experiences side effects from PPIs).                           - Return to GI clinic in 2 months. Ladene Artist, MD 09/02/2018 3:10:21 PM This report has been signed electronically.

## 2018-09-02 NOTE — Op Note (Signed)
[facility] Patient Name: Calvin Conner Procedure Date: 09/02/2018 2:24 PM MRN: [id number] Endoscopist: Ladene Artist , MD Age: 63 Referring MD:  Date of Birth: 01/20/1956 Gender: Male Account #: [id number] Procedure:                Colonoscopy Indications:              Screening for colorectal malignant neoplasm Medicines:                Monitored Anesthesia Care Procedure:                Pre-Anesthesia Assessment:                           - Prior to the procedure, a History and Physical                            was performed, and patient medications and                            allergies were reviewed. The patient's tolerance of                            previous anesthesia was also reviewed. The risks                            and benefits of the procedure and the sedation                            options and risks were discussed with the patient.                            All questions were answered, and informed consent                            was obtained. Prior Anticoagulants: The patient has                            taken no previous anticoagulant or antiplatelet                            agents. ASA Grade Assessment: II - A patient with                            mild systemic disease. After reviewing the risks                            and benefits, the patient was deemed in                            satisfactory condition to undergo the procedure.                           After obtaining informed consent, the colonoscope  was passed under direct vision. Throughout the                            procedure, the patient's blood pressure, pulse, and                            oxygen saturations were monitored continuously. The                            Colonoscope was introduced through the anus and                            advanced to the the cecum, identified by                            appendiceal orifice and  ileocecal valve. The                            ileocecal valve, appendiceal orifice, and rectum                            were photographed. The quality of the bowel                            preparation was excellent. The colonoscopy was                            performed without difficulty. The patient tolerated                            the procedure well. Scope In: 2:30:02 PM Scope Out: 2:48:19 PM Scope Withdrawal Time: 0 hours 12 minutes 49 seconds  Total Procedure Duration: 0 hours 18 minutes 17 seconds  Findings:                 The digital rectal exam findings include firm,                            rounded anal lesion.                           A 8 mm polyp was found in the transverse colon. The                            polyp was sessile. The polyp was removed with a                            cold snare. Resection and retrieval were complete.                           A few small-mouthed diverticula were found in the                            left colon. There was no evidence of diverticular  bleeding.                           Internal hemorrhoids and a 1.5 cm firm, round anal                            lesion were found during retroflexion. The anal                            lesion was below the dentate line and was not                            biopsied. The hemorrhoids were small and Grade I                            (internal hemorrhoids that do not prolapse).                           The exam was otherwise without abnormality on                            direct and retroflexion views. Complications:            No immediate complications. Estimated blood loss:                            None. Estimated Blood Loss:     Estimated blood loss: none. Impression:               - Palpable anal lesion found on digital rectal exam.                           - One 8 mm polyp in the transverse colon, removed                            with a  cold snare. Resected and retrieved.                           - Mild diverticulosis in the left colon.                           - Internal hemorrhoids.                           - The examination was otherwise normal on direct                            and retroflexion views. Recommendation:           - Repeat colonoscopy date to be determined after                            pending pathology results are reviewed.                           - Patient has a contact number available  for                            emergencies. The signs and symptoms of potential                            delayed complications were discussed with the                            patient. Return to normal activities tomorrow.                            Written discharge instructions were provided to the                            patient.                           - Resume previous diet.                           - Continue present medications.                           - Await pathology results.                           - Colorectal surgeon referral to further evaluate                            the anal lesion. Ladene Artist, MD 09/02/2018 3:03:12 PM This report has been signed electronically.

## 2018-09-03 ENCOUNTER — Telehealth: Payer: Self-pay | Admitting: Gastroenterology

## 2018-09-03 NOTE — Telephone Encounter (Signed)
Patient states he received famotidine 20 mg from the pharmacy and was told to take 2 tablets by mouth twice daily. Informed patient we sent the script in for famotine 40 mg to take twice daily but this medication has been on back order at some pharmacies. Informed patient that maybe the pharmacy gave him the 20 mg tablets and told him to take 2 to equal 40 mg tablets. Patient verbalized understanding that he should be taking famotidine 40 mg twice daily in whatever form. Pt agreed.

## 2018-09-04 ENCOUNTER — Telehealth: Payer: Self-pay

## 2018-09-04 NOTE — Telephone Encounter (Signed)
  Follow up Call-  Call back number 09/02/2018  Post procedure Call Back phone  # [phone]  Permission to leave phone message Yes  Some recent data might be hidden     Patient questions:  Do you have a fever, pain , or abdominal swelling? No. Pain Score  0 *  Have you tolerated food without any problems? Yes.    Have you been able to return to your normal activities? Yes.    Do you have any questions about your discharge instructions: Diet   No. Medications  No. Follow up visit  No.  Do you have questions or concerns about your Care? No.  Actions: * If pain score is 4 or above: No action needed, pain <4.  1. Have you developed a fever since your procedure? no  2.   Have you had an respiratory symptoms (SOB or cough) since your procedure? no  3.   Have you tested positive for COVID 19 since your procedure no  4.   Have you had any family members/close contacts diagnosed with the COVID 19 since your procedure?  no   If yes to any of these questions please route to Joylene John, RN and Alphonsa Gin, Therapist, sports.

## 2018-09-08 ENCOUNTER — Ambulatory Visit (INDEPENDENT_AMBULATORY_CARE_PROVIDER_SITE_OTHER): Payer: Medicare Other | Admitting: *Deleted

## 2018-09-08 DIAGNOSIS — R002 Palpitations: Secondary | ICD-10-CM | POA: Diagnosis not present

## 2018-09-08 LAB — CUP PACEART REMOTE DEVICE CHECK
Date Time Interrogation Session: 20200817134745
Implantable Pulse Generator Implant Date: 20181005

## 2018-09-11 ENCOUNTER — Encounter: Payer: Self-pay | Admitting: Gastroenterology

## 2018-09-11 ENCOUNTER — Telehealth: Payer: Self-pay | Admitting: Gastroenterology

## 2018-09-11 NOTE — Telephone Encounter (Signed)
Patient records faxed again and I left a VM for Judson Roch

## 2018-09-11 NOTE — Telephone Encounter (Signed)
Left message for patient to call back  

## 2018-09-12 NOTE — Telephone Encounter (Signed)
Pt returned your call regarding results.  °

## 2018-09-12 NOTE — Telephone Encounter (Signed)
Patient notified of the results and recommendations.  He would ike to have [facility] also see him for eval of a slipped NISSEN. I added that to the referral. Sarah at [facility] notified. He is scheduled to see Dr. Johney Maine to discuss both the anal mass and possible NISSEN repair

## 2018-09-16 NOTE — Progress Notes (Signed)
Carelink Summary Report / Loop Recorder 

## 2018-09-22 ENCOUNTER — Encounter: Payer: Self-pay | Admitting: Surgery

## 2018-09-23 ENCOUNTER — Telehealth: Payer: Self-pay | Admitting: Gastroenterology

## 2018-09-23 MED ORDER — OMEPRAZOLE 20 MG PO CPDR: 20.0000 mg | DELAYED_RELEASE_CAPSULE | Freq: Every day | ORAL | 11 refills | Status: DC

## 2018-09-23 NOTE — Telephone Encounter (Signed)
Patient states he was told that we were going to prescribe omeprazole after his Colonoscopy but patient states he discouraged the doctor from doing so and was put on pepcid instead. Patient states it was a huge mistake and he is not feeling better at all. Patient would like to go on omeprazole now. What dosage would like patient to start Dr. Fuller Plan?

## 2018-09-23 NOTE — Telephone Encounter (Signed)
Pt requested to be prescribed omeprazole.  He stated that famotidine is not working well for him.

## 2018-09-23 NOTE — Telephone Encounter (Signed)
Prescription sent to patient's pharmacy.

## 2018-09-23 NOTE — Telephone Encounter (Signed)
Omeprazole 20 mg po qd, 1 year of refills 

## 2018-09-30 ENCOUNTER — Telehealth: Payer: Self-pay | Admitting: Gastroenterology

## 2018-09-30 MED ORDER — ESOMEPRAZOLE MAGNESIUM 40 MG PO CPDR: 40.0000 mg | DELAYED_RELEASE_CAPSULE | Freq: Every day | ORAL | 3 refills | Status: DC

## 2018-09-30 NOTE — Telephone Encounter (Signed)
Patient states he was taking the Nexium prior to the EGD and felt like it was really helping and the omeprazole he is on now is not working very well. Patient states she wishes to be switched back to Nexium. Informed patient I will send Nexium to his pharmacy in the place of omeprazole. Patient verbalized understanding.

## 2018-09-30 NOTE — Telephone Encounter (Signed)
Pt reported that Prilosec has not been working for him and requested to go back on Nexium.

## 2018-10-07 NOTE — Telephone Encounter (Signed)
Patient saw Dr. Johney Maine on 09/22/18

## 2018-10-13 ENCOUNTER — Ambulatory Visit (INDEPENDENT_AMBULATORY_CARE_PROVIDER_SITE_OTHER): Payer: Medicare Other | Admitting: *Deleted

## 2018-10-13 DIAGNOSIS — R002 Palpitations: Secondary | ICD-10-CM

## 2018-10-13 LAB — CUP PACEART REMOTE DEVICE CHECK
Date Time Interrogation Session: 20200919144011
Implantable Pulse Generator Implant Date: 20181005

## 2018-10-20 NOTE — Progress Notes (Signed)
Carelink Summary Report / Loop Recorder 

## 2018-11-13 ENCOUNTER — Ambulatory Visit (INDEPENDENT_AMBULATORY_CARE_PROVIDER_SITE_OTHER): Payer: Medicare Other | Admitting: *Deleted

## 2018-11-13 DIAGNOSIS — R002 Palpitations: Secondary | ICD-10-CM

## 2018-11-13 DIAGNOSIS — I471 Supraventricular tachycardia: Secondary | ICD-10-CM

## 2018-11-13 LAB — CUP PACEART REMOTE DEVICE CHECK
Date Time Interrogation Session: 20201022141415
Implantable Pulse Generator Implant Date: 20181005

## 2018-11-25 NOTE — Progress Notes (Signed)
Carelink Summary Report / Loop Recorder 

## 2018-12-16 ENCOUNTER — Ambulatory Visit (INDEPENDENT_AMBULATORY_CARE_PROVIDER_SITE_OTHER): Payer: Medicare Other | Admitting: *Deleted

## 2018-12-16 DIAGNOSIS — R002 Palpitations: Secondary | ICD-10-CM | POA: Diagnosis not present

## 2018-12-16 LAB — CUP PACEART REMOTE DEVICE CHECK
Date Time Interrogation Session: 20201124105834
Implantable Pulse Generator Implant Date: 20181005

## 2019-01-13 NOTE — Progress Notes (Signed)
ILR remote 

## 2019-01-19 ENCOUNTER — Ambulatory Visit (INDEPENDENT_AMBULATORY_CARE_PROVIDER_SITE_OTHER): Payer: Medicare Other | Admitting: *Deleted

## 2019-01-19 DIAGNOSIS — R002 Palpitations: Secondary | ICD-10-CM

## 2019-01-19 LAB — CUP PACEART REMOTE DEVICE CHECK
Date Time Interrogation Session: 20201227105646
Implantable Pulse Generator Implant Date: 20181005

## 2019-01-20 NOTE — Progress Notes (Signed)
ILR remote 

## 2019-01-26 ENCOUNTER — Other Ambulatory Visit: Payer: Self-pay | Admitting: Gastroenterology

## 2019-02-04 ENCOUNTER — Other Ambulatory Visit: Payer: Self-pay

## 2019-02-04 ENCOUNTER — Ambulatory Visit (INDEPENDENT_AMBULATORY_CARE_PROVIDER_SITE_OTHER): Payer: Medicare Other | Admitting: Adult Health

## 2019-02-04 ENCOUNTER — Encounter: Payer: Self-pay | Admitting: Adult Health

## 2019-02-04 DIAGNOSIS — F411 Generalized anxiety disorder: Secondary | ICD-10-CM

## 2019-02-04 DIAGNOSIS — G4700 Insomnia, unspecified: Secondary | ICD-10-CM | POA: Diagnosis not present

## 2019-02-04 DIAGNOSIS — F331 Major depressive disorder, recurrent, moderate: Secondary | ICD-10-CM

## 2019-02-04 DIAGNOSIS — F909 Attention-deficit hyperactivity disorder, unspecified type: Secondary | ICD-10-CM | POA: Diagnosis not present

## 2019-02-04 NOTE — Progress Notes (Signed)
[facility] MD/PA/NP Initial Note  02/04/2019 5:04 PM Calvin Conner  MRN:  [id number]  Chief Complaint:  Chief Complaint    ADHD; Anxiety; Depression; Insomnia      HPI:   Describes mood today as "not good". Mood symptoms - reports depression, anxiety, and irritability. Stating "at best my mood is in the toilet". Stating "it has been that way all my life". Has "mood cycling". Stating "Im depressed and always anxious with a lot of agitation". Feels like tea and coffee are helpful. Has "given up" on psychotropic medication. Has seen Dr Casimiro Needle in the past. Determined he was not Manic depressive. Has not been told he was bipolar type 2.  Seeing PCP most recently for psychotropic medications. Willing to consider other "options". Would like to continue the Clonazepam 1mg  TID and the Ambien 10mg  at bedtime. PCP does not feel comfortable prescribing. Decreased interest and motivation. Taking medications as prescribed.   Energy levels low. Active, does not have a regular exercise routine. Retired Forensic psychologist. Enjoys some usual interests and activities. Married. Lives with wife of 34 years. Has 2 grown children. Spending time with family. Likes to garden. Is a historian.  Appetite adequate. Weight stable. Sleeps well most nights. Averages 6 to 7 hours. Naps some during the day. Has chronic fatigue syndrome.  Focus and concentration difficulties "all day non-stop". Diagnosed first time 20 years ago. Has attempted taking Ritalin and and Adderall and did not tolerate. Completing tasks. Managing aspects of household.  Denies SI or HI. Denies AH or VH.  Previous medications: Prozac, Zoloft, Celexa - all mentally confused. Paxil - caused hypomanic episode. Remeron - nothing. Wellbutrin - made him jumpy. Nortriptyline - irritable and screaming at people. Trazadone has been the best - kept him up all night.   Therapy: CBT 5 times helped anxiety - did not help my depression.  Visit Diagnosis:    ICD-10-CM   1.  Generalized anxiety disorder  F41.1   2. Major depressive disorder, recurrent episode, moderate (HCC)  F33.1   3. Insomnia, unspecified type  G47.00   4. Attention deficit hyperactivity disorder (ADHD), unspecified ADHD type  F90.9     Past Psychiatric History: Denies psychiatric hospitalization.  Past Medical History:  Past Medical History:  Diagnosis Date  . Anxiety   . Cardiac arrhythmia   . Depression   . Fibromyalgia   . GERD (gastroesophageal reflux disease)    w/ LA Grade A erosive esophagitis  . Hiatal hernia   . IBS (irritable bowel syndrome)   . Internal hemorrhoids     Past Surgical History:  Procedure Laterality Date  . LAPAROSCOPIC NISSEN FUNDOPLICATION  [id number]   Dr Jackolyn Confer  . LOOP RECORDER INSERTION N/A 10/26/2016   Procedure: LOOP RECORDER INSERTION;  Surgeon: Evans Lance, MD;  Location: [facility];  Service: Cardiovascular;  Laterality: N/A;  . SVT ABLATION N/A 03/28/2018   Procedure: SVT ABLATION;  Surgeon: Evans Lance, MD;  Location: [facility];  Service: Cardiovascular;  Laterality: N/A;    Family Psychiatric History: Son at age 35 admitted for suicidally.   Family History:  Family History  Problem Relation Age of Onset  . Breast cancer Mother   . Colon cancer Neg Hx   . Esophageal cancer Neg Hx   . Pancreatic cancer Neg Hx   . Stomach cancer Neg Hx   . Liver disease Neg Hx     Social History:  Social History   Socioeconomic History  .  Marital status: Married    Spouse name: Not on file  . Number of children: 2  . Years of education: Not on file  . Highest education level: Not on file  Occupational History  . Not on file  Tobacco Use  . Smoking status: Never Smoker  . Smokeless tobacco: Never Used  Substance and Sexual Activity  . Alcohol use: No  . Drug use: No  . Sexual activity: Not on file  Other Topics Concern  . Not on file  Social History Narrative  . Not on file   Social Determinants of  Health   Financial Resource Strain:   . Difficulty of Paying Living Expenses: Not on file  Food Insecurity:   . Worried About Charity fundraiser in the Last Year: Not on file  . Ran Out of Food in the Last Year: Not on file  Transportation Needs:   . Lack of Transportation (Medical): Not on file  . Lack of Transportation (Non-Medical): Not on file  Physical Activity:   . Days of Exercise per Week: Not on file  . Minutes of Exercise per Session: Not on file  Stress:   . Feeling of Stress : Not on file  Social Connections:   . Frequency of Communication with Friends and Family: Not on file  . Frequency of Social Gatherings with Friends and Family: Not on file  . Attends Religious Services: Not on file  . Active Member of Clubs or Organizations: Not on file  . Attends Archivist Meetings: Not on file  . Marital Status: Not on file    Allergies:  Allergies  Allergen Reactions  . Penicillins Other (See Comments)    Childhood allergy   Did it involve swelling of the face/tongue/throat, SOB, or low BP? Unknown Did it involve sudden or severe rash/hives, skin peeling, or any reaction on the inside of your mouth or nose? Unknown Did you need to seek medical attention at a hospital or doctor's office? Unknown When did it last happen? If all above answers are "NO", may proceed with cephalosporin use.     Metabolic Disorder Labs: Lab Results  Component Value Date   HGBA1C 5.2 10/24/2016   MPG 102.54 10/24/2016   No results found for: PROLACTIN Lab Results  Component Value Date   CHOL 174 10/24/2016   TRIG 32 10/24/2016   HDL 54 10/24/2016   CHOLHDL 3.2 10/24/2016   VLDL 6 10/24/2016   LDLCALC 114 (H) 10/24/2016   Lab Results  Component Value Date   TSH 3.571 10/24/2016   TSH 1.36 11/16/2010    Therapeutic Level Labs: No results found for: LITHIUM No results found for: VALPROATE No components found for:  CBMZ  Current Medications: Current  Outpatient Medications  Medication Sig Dispense Refill  . clonazePAM (KLONOPIN) 1 MG tablet Take 1 mg by mouth 3 (three) times daily as needed for anxiety.    Marland Kitchen esomeprazole (NEXIUM) 40 MG capsule TAKE 1 CAPSULE ONCE DAILY AT 12 NOON. 30 capsule 5  . zolpidem (AMBIEN) 10 MG tablet Take 10 mg by mouth at bedtime.     No current facility-administered medications for this visit.    Medication Side Effects: none  Orders placed this visit:  No orders of the defined types were placed in this encounter.   Psychiatric Specialty Exam:  Review of Systems  Musculoskeletal: Negative for gait problem.  Neurological: Negative for tremors.  Psychiatric/Behavioral:       Please refer to HPI  There were no vitals taken for this visit.There is no height or weight on file to calculate BMI.  General Appearance: Neat and Well Groomed  Eye Contact:  Good  Speech:  Clear and Coherent and Normal Rate  Volume:  Normal  Mood:  Euthymic  Affect:  Appropriate and Congruent  Thought Process:  Coherent and Descriptions of Associations: Intact  Orientation:  Full (Time, Place, and Person)  Thought Content: Logical   Suicidal Thoughts:  No  Homicidal Thoughts:  No  Memory:  WNL  Judgement:  Good  Insight:  Good  Psychomotor Activity:  Normal  Concentration:  Concentration: Good  Recall:  Good  Fund of Knowledge: Good  Language: Good  Assets:  Communication Skills Desire for Improvement Financial Resources/Insurance Housing Intimacy Leisure Time Physical Health Resilience Social Support Talents/Skills Transportation Vocational/Educational  ADL's:  Intact  Cognition: WNL  Prognosis:  Good   Screenings: None  Receiving Psychotherapy: No   Treatment Plan/Recommendations:  Plan: PDMP reviewed  1. Ambien 10mg  at hs 2. Clonazepam 1 mg TID   Consider:  Nuvigil - Chronic Fatigue Syndrome  Lamictal - BPD 2  Antipsychotic - Latuda and Vraylar   RTC 4 weeks  Patient advised to  contact office with any questions, adverse effects, or acute worsening in signs and symptoms.  Discussed potential benefits, risk, and side effects of benzodiazepines to include potential risk of tolerance and dependence, as well as possible drowsiness.  Advised patient not to drive if experiencing drowsiness and to take lowest possible effective dose to minimize risk of dependence and tolerance.      Aloha Gell, NP

## 2019-02-05 ENCOUNTER — Telehealth: Payer: Self-pay | Admitting: Adult Health

## 2019-02-05 DIAGNOSIS — F331 Major depressive disorder, recurrent, moderate: Secondary | ICD-10-CM

## 2019-02-05 MED ORDER — LAMOTRIGINE 25 MG PO TABS: ORAL_TABLET | ORAL | 2 refills | Status: DC

## 2019-02-05 NOTE — Telephone Encounter (Signed)
Pt wants to try Lamictal. Came in for 1st appt yesterday. Please send to [facility].

## 2019-02-12 ENCOUNTER — Ambulatory Visit: Payer: Medicare Other | Attending: Internal Medicine

## 2019-02-12 DIAGNOSIS — Z23 Encounter for immunization: Secondary | ICD-10-CM | POA: Insufficient documentation

## 2019-02-12 NOTE — Progress Notes (Signed)
   Covid-19 Vaccination Clinic  Name:  Calvin Conner    MRN: [id number] DOB: 1955/08/13  02/12/2019  [name] was observed post Covid-19 immunization for 15 minutes without incidence. He was provided with Vaccine Information Sheet and instruction to access the V-Safe system.   [name] was instructed to call 911 with any severe reactions post vaccine: Marland Kitchen Difficulty breathing  . Swelling of your face and throat  . A fast heartbeat  . A bad rash all over your body  . Dizziness and weakness    Immunizations Administered    Name Date Dose VIS Date Route   Pfizer COVID-19 Vaccine 02/12/2019  4:18 PM 0.3 mL 01/02/2019 Intramuscular   Manufacturer: [place]   Lot: [id number]   [place]: [id number]

## 2019-02-19 ENCOUNTER — Ambulatory Visit (INDEPENDENT_AMBULATORY_CARE_PROVIDER_SITE_OTHER): Payer: Medicare Other | Admitting: *Deleted

## 2019-02-19 DIAGNOSIS — R002 Palpitations: Secondary | ICD-10-CM

## 2019-02-19 LAB — CUP PACEART REMOTE DEVICE CHECK
Date Time Interrogation Session: 20210128023429
Implantable Pulse Generator Implant Date: 20181005

## 2019-02-19 NOTE — Progress Notes (Signed)
ILR Remote 

## 2019-03-04 ENCOUNTER — Encounter: Payer: Self-pay | Admitting: Adult Health

## 2019-03-04 ENCOUNTER — Ambulatory Visit (INDEPENDENT_AMBULATORY_CARE_PROVIDER_SITE_OTHER): Payer: Medicare Other | Admitting: Adult Health

## 2019-03-04 DIAGNOSIS — F909 Attention-deficit hyperactivity disorder, unspecified type: Secondary | ICD-10-CM

## 2019-03-04 DIAGNOSIS — G4700 Insomnia, unspecified: Secondary | ICD-10-CM | POA: Diagnosis not present

## 2019-03-04 DIAGNOSIS — F411 Generalized anxiety disorder: Secondary | ICD-10-CM

## 2019-03-04 DIAGNOSIS — F331 Major depressive disorder, recurrent, moderate: Secondary | ICD-10-CM | POA: Diagnosis not present

## 2019-03-04 NOTE — Progress Notes (Signed)
Calvin Conner [id number] [birth year] 64 y.o.  Virtual Visit via Video Note  I connected with pt on 03/04/19 at  9:40 AM EST by video and verified that I am speaking with the correct person using two identifiers.   I discussed the limitations, risks, security and privacy concerns of performing an evaluation and management service by videoand the availability of in person appointments. I also discussed with the patient that there may be a patient responsible charge related to this service. The patient expressed understanding and agreed to proceed.   I discussed the assessment and treatment plan with the patient. The patient was provided an opportunity to ask questions and all were answered. The patient agreed with the plan and demonstrated an understanding of the instructions.   The patient was advised to call back or seek an in-person evaluation if the symptoms worsen or if the condition fails to improve as anticipated.  I provided 30 minutes of non-face-to-face time during this encounter.  The patient was located at home.  The provider was located at [place].   Aloha Gell, NP   Subjective:   Patient ID:  Calvin Conner is a 64 y.o. (DOB 17-Jun-1955) male.  Chief Complaint:  Chief Complaint  Patient presents with  . Anxiety  . Depression  . ADHD  . Insomnia    HPI Calvin Conner presents for follow-up of GAD, MDD, insomnia, and ADHD.   Describes mood today as "about the same". Mood symptoms - reports depression, anxiety, and irritability. Stating "I felt like the Lamictal was helpful initially". Real susceptible to a placebo effects of medications. Thinks things are going well and then it loses it's effectiveness. Felt like the lower dose was helpful, but overall not so much now. The real problem is not sleeping. Waking up at 1:30 to 2:00 in the morning. Taking 3mg  Clonazepam daily and Ambien at bedtime. Stating "I am "physiologically" over stimulated. Getting to  sleep - then wakes up. Decreased interest and motivation. Taking medications as prescribed.  Energy levels remain low. Active, does not have a regular exercise routine.  Enjoys some usual interests and activities. Married. Lives with wife of 54 years. Has 2 grown children. Spending time with family.  Appetite adequate. Weight stable. Sleep has declined with initiation of Lamictal - "too activating". .  Averages 4 hours. Naps some during the day. Has chronic fatigue syndrome.  Focus and concentration difficulties. Completing tasks. Managing aspects of household. 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 Denies AH or VH.  Previous medications: Prozac, Zoloft, Celexa - all mentally confused. Paxil - caused hypomanic episode. Remeron - nothing. Wellbutrin - made him jumpy. Nortriptyline - irritable and screaming at people. Trazadone has been the best - kept him up all night, lamictal - activating.   Therapy: CBT 5 times helped anxiety - did not help my depression.  Review of Systems:  Review of Systems  Musculoskeletal: Negative for gait problem.  Neurological: Negative for tremors.  Psychiatric/Behavioral:       Please refer to HPI    Medications: I have reviewed the patient's current medications.  Current Outpatient Medications  Medication Sig Dispense Refill  . clonazePAM (KLONOPIN) 1 MG tablet Take 1 mg by mouth 3 (three) times daily as needed for anxiety.    Marland Kitchen esomeprazole (NEXIUM) 40 MG capsule TAKE 1 CAPSULE ONCE DAILY AT 12 NOON. 30 capsule 5  . lamoTRIgine (LAMICTAL) 25 MG tablet Take one tablet at bedtime (25mg ) for 14 days, then take  two tablets at bedtime (50mg ) 60 tablet 2  . zolpidem (AMBIEN) 10 MG tablet Take 10 mg by mouth at bedtime.     No current facility-administered medications for this visit.    Medication Side Effects: None  Allergies:  Allergies  Allergen Reactions  . Penicillins Other (See Comments)    Childhood allergy   Did it involve swelling of the  face/tongue/throat, SOB, or low BP? Unknown Did it involve sudden or severe rash/hives, skin peeling, or any reaction on the inside of your mouth or nose? Unknown Did you need to seek medical attention at a hospital or doctor's office? Unknown When did it last happen? If all above answers are "NO", may proceed with cephalosporin use.     Past Medical History:  Diagnosis Date  . Anxiety   . Cardiac arrhythmia   . Depression   . Fibromyalgia   . GERD (gastroesophageal reflux disease)    w/ LA Grade A erosive esophagitis  . Hiatal hernia   . IBS (irritable bowel syndrome)   . Internal hemorrhoids     Family History  Problem Relation Age of Onset  . Breast cancer Mother   . Colon cancer Neg Hx   . Esophageal cancer Neg Hx   . Pancreatic cancer Neg Hx   . Stomach cancer Neg Hx   . Liver disease Neg Hx     Social History   Socioeconomic History  . Marital status: Married    Spouse name: Not on file  . Number of children: 2  . Years of education: Not on file  . Highest education level: Not on file  Occupational History  . Not on file  Tobacco Use  . Smoking status: Never Smoker  . Smokeless tobacco: Never Used  Substance and Sexual Activity  . Alcohol use: No  . Drug use: No  . Sexual activity: Not on file  Other Topics Concern  . Not on file  Social History Narrative  . Not on file   Social Determinants of Health   Financial Resource Strain:   . Difficulty of Paying Living Expenses: Not on file  Food Insecurity:   . Worried About Charity fundraiser in the Last Year: Not on file  . Ran Out of Food in the Last Year: Not on file  Transportation Needs:   . Lack of Transportation (Medical): Not on file  . Lack of Transportation (Non-Medical): Not on file  Physical Activity:   . Days of Exercise per Week: Not on file  . Minutes of Exercise per Session: Not on file  Stress:   . Feeling of Stress : Not on file  Social Connections:   . Frequency of  Communication with Friends and Family: Not on file  . Frequency of Social Gatherings with Friends and Family: Not on file  . Attends Religious Services: Not on file  . Active Member of Clubs or Organizations: Not on file  . Attends Archivist Meetings: Not on file  . Marital Status: Not on file  Intimate Partner Violence:   . Fear of Current or Ex-Partner: Not on file  . Emotionally Abused: Not on file  . Physically Abused: Not on file  . Sexually Abused: Not on file    Past Medical History, Surgical history, Social history, and Family history were reviewed and updated as appropriate.   Please see review of systems for further details on the patient's review from today.   Objective:   Physical Exam:  There  were no vitals taken for this visit.  Physical Exam Constitutional:      General: He is not in acute distress.    Appearance: He is well-developed.  Musculoskeletal:        General: No deformity.  Neurological:     Mental Status: He is alert and oriented to person, place, and time.     Coordination: Coordination normal.  Psychiatric:        Attention and Perception: Attention and perception normal. He does not perceive auditory or visual hallucinations.        Mood and Affect: Mood is anxious. Affect is not labile, blunt, angry or inappropriate.        Speech: Speech normal.        Behavior: Behavior normal.        Thought Content: Thought content normal. Thought content is not paranoid or delusional. Thought content does not include homicidal or suicidal ideation. Thought content does not include homicidal or suicidal plan.        Cognition and Memory: Cognition and memory normal.        Judgment: Judgment normal.     Comments: Insight intact     Lab Review:     Component Value Date/Time   NA 139 03/18/2018 0845   K 4.5 03/18/2018 0845   CL 109 03/18/2018 0845   CO2 23 03/18/2018 0845   GLUCOSE 150 (H) 03/18/2018 0845   BUN 16 03/18/2018 0845    CREATININE 0.99 03/18/2018 0845   CALCIUM 8.9 03/18/2018 0845   PROT 6.1 (L) 03/18/2018 0845   ALBUMIN 3.7 03/18/2018 0845   AST 24 03/18/2018 0845   ALT 27 03/18/2018 0845   ALKPHOS 33 (L) 03/18/2018 0845   BILITOT 0.6 03/18/2018 0845   GFRNONAA >60 03/18/2018 0845   GFRAA >60 03/18/2018 0845       Component Value Date/Time   WBC 5.5 03/18/2018 0845   RBC 4.65 03/18/2018 0845   HGB 14.6 03/18/2018 0845   HCT 43.6 03/18/2018 0845   PLT 185 03/18/2018 0845   MCV 93.8 03/18/2018 0845   MCH 31.4 03/18/2018 0845   MCHC 33.5 03/18/2018 0845   RDW 12.8 03/18/2018 0845   LYMPHSABS 1.0 03/18/2018 0845   MONOABS 0.4 03/18/2018 0845   EOSABS 0.0 03/18/2018 0845   BASOSABS 0.0 03/18/2018 0845    No results found for: POCLITH, LITHIUM   No results found for: PHENYTOIN, PHENOBARB, VALPROATE, CBMZ   .res Assessment: Plan:    Plan: PDMP reviewed  1. Ambien 10mg  at hs 2. Clonazepam 1 mg TID   Taper off Lamictal 50mg  to 25mg  until out and leave off. Will see if sleep improves and call for an update.  Consider:  Nuvigil - Chronic Fatigue Syndrome  Antipsychotic - Seroquel  RTC 4 weeks  Patient advised to contact office with any questions, adverse effects, or acute worsening in signs and symptoms.  Discussed potential benefits, risk, and side effects of benzodiazepines to include potential risk of tolerance and dependence, as well as possible drowsiness.  Advised patient not to drive if experiencing drowsiness and to take lowest possible effective dose to minimize risk of dependence and tolerance.   Laymon was seen today for anxiety, depression, adhd and insomnia.  Diagnoses and all orders for this visit:  Attention deficit hyperactivity disorder (ADHD), unspecified ADHD type  Insomnia, unspecified type  Generalized anxiety disorder  Major depressive disorder, recurrent episode, moderate ([place])    Please see After Visit Summary for patient specific  instructions.  Future Appointments  Date Time Provider [place]  03/05/2019  5:45 PM [place] [facility]  03/22/2019  8:30 AM [facility]OT[facility]  03/31/2019  4:00 PM Evans Lance, MD [facility]  04/22/2019  8:30 AM [facility] DEVICE REMOTES [facility]  05/23/2019  8:30 AM [facility] DEVICE REMOTES [facility]  06/23/2019  8:30 AM [facility] DEVICE REMOTES [facility]  07/24/2019  8:30 AM [facility] DEVICE REMOTES [facility]  08/24/2019  8:30 AM [facility] DEVICE REMOTES [facility]  09/24/2019  8:30 AM [facility] DEVICE REMOTES [facility]  10/25/2019  8:30 AM [facility] DEVICE REMOTES [facility]  11/25/2019  8:30 AM [facility] DEVICE REMOTES [facility]    No orders of the defined types were placed in this encounter.     -------------------------------

## 2019-03-05 ENCOUNTER — Ambulatory Visit: Payer: Medicare Other | Attending: Internal Medicine

## 2019-03-05 DIAGNOSIS — Z23 Encounter for immunization: Secondary | ICD-10-CM | POA: Insufficient documentation

## 2019-03-05 NOTE — Progress Notes (Signed)
   Covid-19 Vaccination Clinic  Name:  Calvin Conner    MRN: [id number] DOB: May 11, 1955  03/05/2019  [name] was observed post Covid-19 immunization for 15 minutes without incidence. He was provided with Vaccine Information Sheet and instruction to access the V-Safe system.   [name] was instructed to call 911 with any severe reactions post vaccine: Marland Kitchen Difficulty breathing  . Swelling of your face and throat  . A fast heartbeat  . A bad rash all over your body  . Dizziness and weakness    Immunizations Administered    Name Date Dose VIS Date Route   Pfizer COVID-19 Vaccine 03/05/2019  5:34 PM 0.3 mL 01/02/2019 Intramuscular   Manufacturer: [place]   Lot: [id number]   [place]: [id number]

## 2019-03-09 ENCOUNTER — Ambulatory Visit: Payer: Medicare Other | Admitting: Adult Health

## 2019-03-22 ENCOUNTER — Ambulatory Visit (INDEPENDENT_AMBULATORY_CARE_PROVIDER_SITE_OTHER): Payer: Medicare Other | Admitting: *Deleted

## 2019-03-22 DIAGNOSIS — R000 Tachycardia, unspecified: Secondary | ICD-10-CM | POA: Diagnosis not present

## 2019-03-22 LAB — CUP PACEART REMOTE DEVICE CHECK
Date Time Interrogation Session: 20210228023354
Implantable Pulse Generator Implant Date: 20181005

## 2019-03-22 NOTE — Progress Notes (Signed)
ILR remote 

## 2019-03-31 ENCOUNTER — Other Ambulatory Visit: Payer: Self-pay

## 2019-03-31 ENCOUNTER — Ambulatory Visit (INDEPENDENT_AMBULATORY_CARE_PROVIDER_SITE_OTHER): Payer: Medicare Other | Admitting: Internal Medicine

## 2019-03-31 ENCOUNTER — Encounter: Payer: Self-pay | Admitting: Internal Medicine

## 2019-03-31 VITALS — BP 116/74 | HR 58 | Ht 74.0 in | Wt 201.0 lb

## 2019-03-31 DIAGNOSIS — R002 Palpitations: Secondary | ICD-10-CM | POA: Diagnosis not present

## 2019-03-31 DIAGNOSIS — I471 Supraventricular tachycardia: Secondary | ICD-10-CM | POA: Diagnosis not present

## 2019-03-31 NOTE — Progress Notes (Signed)
HPI Calvin Conner returns today for followup. He is a pleasant 64 yo man with a h/o palpitations and documented SVT who underwent EP study and ablation about a year ago. He has done well in the interim but would like to have his ILR removed. He has had no recurrent arrhythmia. He denies chest pain or sob. No syncope. No palpitations.  Allergies  Allergen Reactions  . Penicillins Other (See Comments)    Childhood allergy   Did it involve swelling of the face/tongue/throat, SOB, or low BP? Unknown Did it involve sudden or severe rash/hives, skin peeling, or any reaction on the inside of your mouth or nose? Unknown Did you need to seek medical attention at a hospital or doctor's office? Unknown When did it last happen? If all above answers are "NO", may proceed with cephalosporin use.      Current Outpatient Medications  Medication Sig Dispense Refill  . clonazePAM (KLONOPIN) 1 MG tablet Take 1 mg by mouth 3 (three) times daily as needed for anxiety.    Marland Kitchen esomeprazole (NEXIUM) 40 MG capsule TAKE 1 CAPSULE ONCE DAILY AT 12 NOON. 30 capsule 5  . zolpidem (AMBIEN) 10 MG tablet Take 10 mg by mouth at bedtime.     No current facility-administered medications for this visit.     Past Medical History:  Diagnosis Date  . Anxiety   . Cardiac arrhythmia   . Depression   . Fibromyalgia   . GERD (gastroesophageal reflux disease)    w/ LA Grade A erosive esophagitis  . Hiatal hernia   . IBS (irritable bowel syndrome)   . Internal hemorrhoids     ROS:   All systems reviewed and negative except as noted in the HPI.   Past Surgical History:  Procedure Laterality Date  . LAPAROSCOPIC NISSEN FUNDOPLICATION  99991111   Dr Jackolyn Confer  . LOOP RECORDER INSERTION N/A 10/26/2016   Procedure: LOOP RECORDER INSERTION;  Surgeon: Evans Lance, MD;  Location: [facility];  Service: Cardiovascular;  Laterality: N/A;  . SVT ABLATION N/A 03/28/2018   Procedure: SVT ABLATION;   Surgeon: Evans Lance, MD;  Location: [facility];  Service: Cardiovascular;  Laterality: N/A;     Family History  Problem Relation Age of Onset  . Breast cancer Mother   . Colon cancer Neg Hx   . Esophageal cancer Neg Hx   . Pancreatic cancer Neg Hx   . Stomach cancer Neg Hx   . Liver disease Neg Hx      Social History   Socioeconomic History  . Marital status: Married    Spouse name: Not on file  . Number of children: 2  . Years of education: Not on file  . Highest education level: Not on file  Occupational History  . Not on file  Tobacco Use  . Smoking status: Never Smoker  . Smokeless tobacco: Never Used  Substance and Sexual Activity  . Alcohol use: No  . Drug use: No  . Sexual activity: Not on file  Other Topics Concern  . Not on file  Social History Narrative  . Not on file   Social Determinants of Health   Financial Resource Strain:   . Difficulty of Paying Living Expenses: Not on file  Food Insecurity:   . Worried About Charity fundraiser in the Last Year: Not on file  . Ran Out of Food in the Last Year: Not on file  Transportation  Needs:   . Lack of Transportation (Medical): Not on file  . Lack of Transportation (Non-Medical): Not on file  Physical Activity:   . Days of Exercise per Week: Not on file  . Minutes of Exercise per Session: Not on file  Stress:   . Feeling of Stress : Not on file  Social Connections:   . Frequency of Communication with Friends and Family: Not on file  . Frequency of Social Gatherings with Friends and Family: Not on file  . Attends Religious Services: Not on file  . Active Member of Clubs or Organizations: Not on file  . Attends Archivist Meetings: Not on file  . Marital Status: Not on file  Intimate Partner Violence:   . Fear of Current or Ex-Partner: Not on file  . Emotionally Abused: Not on file  . Physically Abused: Not on file  . Sexually Abused: Not on file     BP 116/74   Pulse (!)  58   Ht 6\' 2"  (1.88 m)   Wt 201 lb (91.2 kg)   SpO2 99%   BMI 25.81 kg/m   Physical Exam:  Well appearing NAD HEENT: Unremarkable Neck:  No JVD, no thyromegally Lymphatics:  No adenopathy Back:  No CVA tenderness Lungs:  Clear with no wheezes HEART:  Regular rate rhythm, no murmurs, no rubs, no clicks Abd:  soft, positive bowel sounds, no organomegally, no rebound, no guarding Ext:  2 plus pulses, no edema, no cyanosis, no clubbing Skin:  No rashes no nodules Neuro:  CN II through XII intact, motor grossly intact  EKG - nsr  DEVICE  Normal device function.  See PaceArt for details. No arrhythmias on ILR  Assess/Plan: 1. SVT - he has had no recurrent SVT s/p catheter ablation. He will continue his current meds.  2. ILR - he would like to have his ILR removed. We will schedule his ILR removal.  Mikle Bosworth.D.

## 2019-03-31 NOTE — Patient Instructions (Addendum)
Medication Instructions:  Your physician recommends that you continue on your current medications as directed. Please refer to the Current Medication list given to you today.  Labwork: None ordered.  Testing/Procedures: None ordered.  Follow-Up:  April 29, 2019 at 8:30 am with Dr. Lovena Le at the [facility] office.  Arrive at 8:15 am to check in.  No special instructions  Any Other Special Instructions Will Be Listed Below (If Applicable).  If you need a refill on your cardiac medications before your next appointment, please call your pharmacy.

## 2019-04-22 ENCOUNTER — Ambulatory Visit (INDEPENDENT_AMBULATORY_CARE_PROVIDER_SITE_OTHER): Payer: Medicare Other | Admitting: *Deleted

## 2019-04-22 DIAGNOSIS — R000 Tachycardia, unspecified: Secondary | ICD-10-CM | POA: Diagnosis not present

## 2019-04-22 LAB — CUP PACEART REMOTE DEVICE CHECK
Date Time Interrogation Session: 20210331033726
Implantable Pulse Generator Implant Date: 20181005

## 2019-04-22 NOTE — Progress Notes (Signed)
ILR Remote 

## 2019-04-29 ENCOUNTER — Encounter: Payer: Medicare Other | Admitting: Internal Medicine

## 2019-05-13 ENCOUNTER — Other Ambulatory Visit: Payer: Self-pay

## 2019-05-13 MED ORDER — CLONAZEPAM 1 MG PO TABS: 1.0000 mg | ORAL_TABLET | Freq: Three times a day (TID) | ORAL | 1 refills | Status: DC

## 2019-05-13 MED ORDER — ZOLPIDEM TARTRATE 10 MG PO TABS: 10.0000 mg | ORAL_TABLET | Freq: Every day | ORAL | 1 refills | Status: DC

## 2019-05-15 ENCOUNTER — Other Ambulatory Visit: Payer: Self-pay

## 2019-05-15 ENCOUNTER — Ambulatory Visit (INDEPENDENT_AMBULATORY_CARE_PROVIDER_SITE_OTHER): Payer: Medicare Other | Admitting: Internal Medicine

## 2019-05-15 ENCOUNTER — Encounter: Payer: Self-pay | Admitting: Internal Medicine

## 2019-05-15 VITALS — BP 88/60 | HR 72 | Ht 74.0 in

## 2019-05-15 DIAGNOSIS — I471 Supraventricular tachycardia: Secondary | ICD-10-CM

## 2019-05-15 NOTE — Patient Instructions (Addendum)
Medication Instructions:  Your physician recommends that you continue on your current medications as directed. Please refer to the Current Medication list given to you today.  Labwork: None ordered.  Testing/Procedures: None ordered.  Follow-Up:  Your physician wants you to follow-up in: 6 months of Dr. Lovena Le.  You will receive a reminder letter in the mail two months in advance. If you don't receive a letter, please call our office to schedule the follow-up appointment.      Implantable Loop Recorder Placement, Care After Refer to this sheet in the next few weeks. These instructions provide you with information about caring for yourself after your procedure. Your health care provider may also give you more specific instructions. Your treatment has been planned according to current medical practices, but problems sometimes occur. Call your health care provider if you have any problems or questions after your procedure. What can I expect after the procedure? After the procedure, it is common to have:  Soreness or pain near the cut from surgery (incision).  Some swelling or bruising near the incision.  Follow these instructions at home:  Medicines  Take over-the-counter and prescription medicines only as told by your health care provider.  If you were prescribed an antibiotic medicine, take it as told by your health care provider. Do not stop taking the antibiotic even if you start to feel better.  Bathing Do not take baths, swim, or use a hot tub until your health care provider approves. You may shower 3 days after removal of your monitor.  Incision care  Follow instructions from your health care provider about how to take care of your incision. Make sure you: ? Remove your top dressing after 3 days (before you shower) ? Leave stitches (sutures), skin glue, or adhesive strips in place. These skin closures may need to stay in place for 2 weeks or longer. If adhesive strip edges  start to loosen and curl up, you may trim the loose edges. Do not remove adhesive strips completely unless your health care provider tells you to do that.  Check your incision area every day for signs of infection. Check for: ? More redness, swelling, or pain. ? Fluid or blood. ? Warmth. ? Pus or a bad smell.  Contact a health care provider if:  You have more redness, swelling, or pain around your incision.  You have more fluid or blood coming from your incision.  Your incision feels warm to the touch.  You have pus or a bad smell coming from your incision.  You have a fever.  You have pain that is not relieved by your pain medicine.  You have triggered your device because of fainting (syncope) or because of a heartbeat that feels like it is racing, slow, fluttering, or skipping (palpitations).

## 2019-05-15 NOTE — Progress Notes (Signed)
HPI Mr. Witmer returns today for followup. He is a pleasant 64 yo man with palpitations who was found to have SVT and underwent EP study and ablation over a year ago. He has done well in the interim. He has not had more SVT. He would like to have his ILR removed.  Allergies  Allergen Reactions  . Penicillins Other (See Comments)    Childhood allergy   Did it involve swelling of the face/tongue/throat, SOB, or low BP? Unknown Did it involve sudden or severe rash/hives, skin peeling, or any reaction on the inside of your mouth or nose? Unknown Did you need to seek medical attention at a hospital or doctor's office? Unknown When did it last happen? If all above answers are "NO", may proceed with cephalosporin use.      Current Outpatient Medications  Medication Sig Dispense Refill  . clonazePAM (KLONOPIN) 1 MG tablet Take 1 tablet (1 mg total) by mouth 3 (three) times daily. 90 tablet 1  . esomeprazole (NEXIUM) 40 MG capsule TAKE 1 CAPSULE ONCE DAILY AT 12 NOON. 30 capsule 5  . zolpidem (AMBIEN) 10 MG tablet Take 1 tablet (10 mg total) by mouth at bedtime. 30 tablet 1   No current facility-administered medications for this visit.     Past Medical History:  Diagnosis Date  . Anxiety   . Cardiac arrhythmia   . Depression   . Fibromyalgia   . GERD (gastroesophageal reflux disease)    w/ LA Grade A erosive esophagitis  . Hiatal hernia   . IBS (irritable bowel syndrome)   . Internal hemorrhoids     ROS:   All systems reviewed and negative except as noted in the HPI.   Past Surgical History:  Procedure Laterality Date  . LAPAROSCOPIC NISSEN FUNDOPLICATION  99991111   Dr Jackolyn Confer  . LOOP RECORDER INSERTION N/A 10/26/2016   Procedure: LOOP RECORDER INSERTION;  Surgeon: Evans Lance, MD;  Location: [facility];  Service: Cardiovascular;  Laterality: N/A;  . SVT ABLATION N/A 03/28/2018   Procedure: SVT ABLATION;  Surgeon: Evans Lance, MD;   Location: [facility] [facility];  Service: Cardiovascular;  Laterality: N/A;     Family History  Problem Relation Age of Onset  . Breast cancer Mother   . Colon cancer Neg Hx   . Esophageal cancer Neg Hx   . Pancreatic cancer Neg Hx   . Stomach cancer Neg Hx   . Liver disease Neg Hx      Social History   Socioeconomic History  . Marital status: Married    Spouse name: Not on file  . Number of children: 2  . Years of education: Not on file  . Highest education level: Not on file  Occupational History  . Not on file  Tobacco Use  . Smoking status: Never Smoker  . Smokeless tobacco: Never Used  Substance and Sexual Activity  . Alcohol use: No  . Drug use: No  . Sexual activity: Not on file  Other Topics Concern  . Not on file  Social History Narrative  . Not on file   Social Determinants of Health   Financial Resource Strain:   . Difficulty of Paying Living Expenses:   Food Insecurity:   . Worried About Charity fundraiser in the Last Year:   . Arboriculturist in the Last Year:   Transportation Needs:   . Film/video editor (Medical):   Marland Kitchen  Lack of Transportation (Non-Medical):   Physical Activity:   . Days of Exercise per Week:   . Minutes of Exercise per Session:   Stress:   . Feeling of Stress :   Social Connections:   . Frequency of Communication with Friends and Family:   . Frequency of Social Gatherings with Friends and Family:   . Attends Religious Services:   . Active Member of Clubs or Organizations:   . Attends Archivist Meetings:   Marland Kitchen Marital Status:   Intimate Partner Violence:   . Fear of Current or Ex-Partner:   . Emotionally Abused:   Marland Kitchen Physically Abused:   . Sexually Abused:      BP (!) 88/60   Pulse 72   Ht 6\' 2"  (1.88 m)   SpO2 96%   BMI 25.81 kg/m   Physical Exam:  Well appearing 64 yo man, NAD HEENT: Unremarkable Neck:  6 cm JVD, no thyromegally Lymphatics:  No adenopathy Back:  No CVA tenderness Lungs:  Clear  with no wheezes HEART:  Regular rate rhythm, no murmurs, no rubs, no clicks Abd:  soft, positive bowel sounds, no organomegally, no rebound, no guarding Ext:  2 plus pulses, no edema, no cyanosis, no clubbing Skin:  No rashes no nodules Neuro:  CN II through XII intact, motor grossly intact  DEVICE  Normal device function.  See PaceArt for details. No arrhythmias. Assess/Plan: 1. SVT - he is asymptomatic, s/p catheter ablation with no recurrent SVT on his ILR. 2. Palpitations - these have resolved.  3. ILR - the patient would like to undergo ILR removal and I have reviewed the indications/benefits/goals/expectations and he would like to proceed.  Doris Mcgilvery,M.D.  EP Procedure Note  Procedure performed: ILR removal.  Preoperative diagnosis: SVT/Palpitations  Postoperative diagnosis: same as preoperative diagnsosis  Description of the procedure: after informed consent was obtained, the patient was prepped and draped in a sterile fashion. 10 cc of lidocaine was infiltrated. A one cm stab incision was carried out. A combination of sharp and blunt dissection was utilized and the ILR was grasped. The scar tissue was cut away from the ILR. The ILR was removed. Hemostasis was assured. Benzoin and steristrips were painted on the skin. A bandage was applied and the patient recovered in the usual manner.  Complications: none immediately.  Conclusion: successful ILR removal.  Mikle Bosworth.D.

## 2019-07-28 ENCOUNTER — Telehealth: Payer: Self-pay | Admitting: Gastroenterology

## 2019-07-28 MED ORDER — ESOMEPRAZOLE MAGNESIUM 40 MG PO CPDR: DELAYED_RELEASE_CAPSULE | ORAL | 1 refills | Status: DC

## 2019-07-28 NOTE — Telephone Encounter (Signed)
Patient is requesting refill on Nexium also to be sent to [facility] in [place]

## 2019-07-28 NOTE — Telephone Encounter (Signed)
Prescription sent to patient's pharmacy. Patient scheduled follow up visit on 10/01/19.

## 2019-09-22 ENCOUNTER — Other Ambulatory Visit: Payer: Self-pay | Admitting: Gastroenterology

## 2019-10-01 ENCOUNTER — Encounter: Payer: Self-pay | Admitting: Gastroenterology

## 2019-10-01 ENCOUNTER — Ambulatory Visit (INDEPENDENT_AMBULATORY_CARE_PROVIDER_SITE_OTHER): Payer: Medicare Other | Admitting: Gastroenterology

## 2019-10-01 VITALS — BP 108/70 | HR 68 | Ht 74.0 in | Wt 197.0 lb

## 2019-10-01 DIAGNOSIS — K648 Other hemorrhoids: Secondary | ICD-10-CM

## 2019-10-01 DIAGNOSIS — K2100 Gastro-esophageal reflux disease with esophagitis, without bleeding: Secondary | ICD-10-CM | POA: Diagnosis not present

## 2019-10-01 MED ORDER — ESOMEPRAZOLE MAGNESIUM 40 MG PO CPDR: DELAYED_RELEASE_CAPSULE | ORAL | 11 refills | Status: DC

## 2019-10-01 NOTE — Progress Notes (Signed)
    History of Present Illness: This is a 64 year old male with GERD and LA class A esophagitis.  His reflux symptoms have been under very good control on daily Nexium.  He relates a follow-up visit with Dr. Johney Maine and August 2020 and the patient relates that Dr. Johney Maine felt the anal lesion noted at colonoscopy was an atypical hemorrhoid.  He also advised him that it redo fundoplication surgery was probably not advisable.  We have requested Dr. Clyda Greener office notes.  EGD 08/2018 - LA Grade A reflux esophagitis. - Gastritis. Biopsied. Normal mucosa - Evidence of prior fundoplication. - Normal duodenal bulb and second portion of the duodenum.  Colonoscopy 08/2018 - Palpable anal lesion found on digital rectal exam. - One 8 mm polyp in the transverse colon, removed with a cold snare. Resected and Retrieved. Hyperplastic  - Mild diverticulosis in the left colon. - Internal hemorrhoids. - The examination was otherwise normal on direct and retroflexion views.  Current Medications, Allergies, Past Medical History, Past Surgical History, Family History and Social History were reviewed in [employer] record.   Physical Exam: General: Well developed, well nourished, no acute distress Head: Normocephalic and atraumatic Eyes:  sclerae anicteric, EOMI Ears: Normal auditory acuity Mouth: Not examined, mask on during Covid-19 pandemic Lungs: Clear throughout to auscultation Heart: Regular rate and rhythm; no murmurs, rubs or bruits Abdomen: Soft, non tender and non distended. No masses, hepatosplenomegaly or hernias noted. Normal Bowel sounds Rectal: Not done Musculoskeletal: Symmetrical with no gross deformities  Pulses:  Normal pulses noted Extremities: No clubbing, cyanosis, edema or deformities noted Neurological: Alert oriented x 4, grossly nonfocal Psychological:  Alert and cooperative. Normal mood and affect   Assessment and Recommendations:  1. GERD with LA Class A  esophagitis. Slipped Nissen.  Follow standard antireflux measures.  Continue Nexium 40 mg p.o. daily.  Await records from Dr. Clyda Greener office but redo fundoplication does not appear indicated. REV in 1 year.   2.  Atypical internal hemorrhoid apparently diagnosed by Dr. Johney Maine.  Await records from Dr. Clyda Greener office.  3. CRC screening, average risk.  A 10-year interval screening colonoscopy is recommended in August 2030.

## 2019-10-01 NOTE — Patient Instructions (Signed)
We have sent the following medications to your pharmacy for you to pick up at your convenience:Nexium.   Thank you for choosing me and [facility].  Malcolm T. Stark, Jr., MD., FACG   

## 2020-01-14 ENCOUNTER — Telehealth: Payer: Self-pay | Admitting: Gastroenterology

## 2020-01-14 NOTE — Telephone Encounter (Signed)
Patient states he was recommended by Dr. Fuller Plan to have his PCP continue filling his Nexium prescription. Patient states he has been having increase acid issues and was going to have his primary care physician change his Nexium to another acid medication. Patient states he wanted to know what PPI's and H2 blockers he has been on in the past so he can tell his PCP. Informed patient he has tried, omeprazole, pantoprazole and famotidine. Patient verbalized understanding.

## 2020-01-14 NOTE — Telephone Encounter (Signed)
Inbound call from patient requesting a call back please.  Wants to know what medications he was on before starting on Nexium.

## 2020-03-14 ENCOUNTER — Ambulatory Visit: Payer: Medicare Other | Admitting: Adult Health

## 2020-08-09 ENCOUNTER — Ambulatory Visit (INDEPENDENT_AMBULATORY_CARE_PROVIDER_SITE_OTHER): Payer: Medicare Other | Admitting: Allergy & Immunology

## 2020-08-09 ENCOUNTER — Other Ambulatory Visit: Payer: Self-pay

## 2020-08-09 ENCOUNTER — Encounter: Payer: Self-pay | Admitting: Allergy & Immunology

## 2020-08-09 VITALS — BP 124/76 | HR 71 | Temp 97.6°F | Resp 18 | Ht 74.0 in | Wt 205.2 lb

## 2020-08-09 DIAGNOSIS — J3089 Other allergic rhinitis: Secondary | ICD-10-CM | POA: Diagnosis not present

## 2020-08-09 DIAGNOSIS — J302 Other seasonal allergic rhinitis: Secondary | ICD-10-CM | POA: Diagnosis not present

## 2020-08-09 NOTE — Progress Notes (Signed)
Aeroallergen Immunotherapy   Ordering Provider: Dr. Salvatore Marvel   Patient Details  Name: Calvin Conner  MRN: [id number]  Date of Birth: 02/19/1955   Order 1 of 2   Vial Label: W/T/C/D   0.5 ml (Volume)  1:20 Concentration -- Weed Mix*  0.2 ml (Volume)  1:20 Concentration -- Ash mix*  0.2 ml (Volume)  1:20 Concentration -- Steva Colder American*  0.2 ml (Volume)  1:20 Concentration -- Box Elder  0.2 ml (Volume)  1:20 Concentration -- Elm Mix*  0.2 ml (Volume)  1:20 Concentration -- Maple Mix*  0.2 ml (Volume)  1:10 Concentration -- Sycamore Eastern*  0.2 ml (Volume)  1:20 Concentration -- Walnut, Black Pollen  0.5 ml (Volume)  1:10 Concentration -- Cat Hair  0.5 ml (Volume)  1:10 Concentration -- Dog Epithelia    2.9  ml Extract Subtotal  2.1  ml Diluent  5.0  ml Maintenance Total   Schedule:  A   Blue Vial (1:100,000): Schedule A (10 doses)  Yellow Vial (1:10,000): Schedule A (10 doses)  Green Vial (1:1,000): Schedule A (10 doses)  Red Vial (1:100): Schedule A (10 doses)   Special Instructions: patient does NOT tolerate antihistamines, so he will not be premedicating.

## 2020-08-09 NOTE — Progress Notes (Signed)
NEW PATIENT  Date of Service/Encounter:  08/09/20  Consult requested by: Audley Hose, MD   Assessment:   Seasonal and perennial allergic rhinitis - Plan: Allergy Test, Interdermal Allergy Test  Intolerance to antihistamines and nasal steroids  Chronic fatigue syndrome   Calvin Conner has a number of indoor and outdoor allergens today and testing.  We did discuss using nasal antihistamines to see if that would provide any relief to his symptoms, but he was not really willing to try since he had reacted so terribly to systemic antihistamines in the past.  I think that is reasonable.  We are going to go ahead and start allergen immunotherapy.  He is going to continue with nasal saline rinses.  Hopefully we can get some symptom improvement with the use of allergy shots, although I did tell him it can take a while to see improvement with those.  Plan/Recommendations:    Patient Instructions  1. Perennial and seasonal allergic rhinitis - Testing today showed: ragweed, weeds, trees, indoor molds, outdoor molds, dust mites, cat, dog, and cockroach - Copy of test results provided.  - Avoidance measures provided. - Continue with: nasal saline rinses daily - We will schedule you to start allergy shots. - As you know, this is a 3-5 year commitment.  - Consider allergy shots as a means of long-term control. - Allergy shots "re-train" and "reset" the immune system to ignore environmental allergens and decrease the resulting immune response to those allergens (sneezing, itchy watery eyes, runny nose, nasal congestion, etc).    - Allergy shots improve symptoms in 75-85% of patients.   2. Return in about 6 months (around 02/09/2021).    This note in its entirety was forwarded to the Provider who requested this consultation.  Subjective:   Calvin Conner is a 65 y.o. male presenting today for evaluation of  Chief Complaint  Patient presents with   Allergic Rhinitis     Congestion,  sneezing, itchy water eyes, - no using an allergy medications     Calvin Conner has a history of the following: Patient Active Problem List   Diagnosis Date Noted   Seasonal and perennial allergic rhinitis 08/09/2020   SVT (supraventricular tachycardia) ([facility]) 03/28/2018   Elevated troponin 10/24/2016   Palpitation 10/24/2016   SOB (shortness of breath) 10/24/2016   Anxiety 10/24/2016   Tachycardia    HIATAL HERNIA 09/07/2008   Depression 05/30/2007   GERD 05/30/2007   IRRITABLE BOWEL SYNDROME 05/30/2007   FIBROMYALGIA 05/30/2007    History obtained from: chart review and patient.  Wells Guiles was referred by Audley Hose, MD.     Calvin Conner is a 65 y.o. male presenting for an evaluation of environmental allergies .  He started having allergies when he was a child ([place] up all over). He got allergy shots when he was a child and in graduate school (this was in [place]). Each set of shots improved symptoms. He started having symptoms again two years ago. He is worse in the spring when everything is covered with pollen. At this point he is doing fine, but April he was not doing well. He does not tolerate antihistamines. Benadryl makes him loop, Claritin causes insomnia as does Zyrtec. He did not try Allegra or Xyzal. He has been in the [place] for 40+ years.   He does not like nose sprays because he does not tolerate prednisone. Afrin makes him loop. He has not been on nasal antihistamines at all.  He has been on Flonase, Nasacort, Rhincort with problems. All of the steroids makes him loopy. His toleration of medications is erratic due to chronic fatigue syndrome.   He had asthma when he was an infant. He does have some issues with processed foods and chemicals. [employer] would cause "death" due to the chemicals. He has a legume allergy that is only an issue in the spring time. He can eat beans and peanuts during the April-May season. Otherwise he is fine. He was using  various homeopathic remedies that stopped working in the last few years.   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 He does have some stories. He has two children one in [place] and one in [place].   Otherwise, there is no history of other atopic diseases, including asthma, drug allergies, stinging insect allergies, urticaria, or contact dermatitis. There is no significant infectious history. Vaccinations are up to date.    Past Medical History: Patient Active Problem List   Diagnosis Date Noted   Seasonal and perennial allergic rhinitis 08/09/2020   SVT (supraventricular tachycardia) ([facility]) 03/28/2018   Elevated troponin 10/24/2016   Palpitation 10/24/2016   SOB (shortness of breath) 10/24/2016   Anxiety 10/24/2016   Tachycardia    HIATAL HERNIA 09/07/2008   Depression 05/30/2007   GERD 05/30/2007   IRRITABLE BOWEL SYNDROME 05/30/2007   FIBROMYALGIA 05/30/2007    Medication List:  Allergies as of 08/09/2020       Reactions   Penicillins Other (See Comments)   Childhood allergy  Did it involve swelling of the face/tongue/throat, SOB, or low BP? Unknown Did it involve sudden or severe rash/hives, skin peeling, or any reaction on the inside of your mouth or nose? Unknown Did you need to seek medical attention at a hospital or doctor's office? Unknown When did it last happen?       If all above answers are "NO", may proceed with cephalosporin use.        Medication List        Accurate as of August 09, 2020 11:10 AM. If you have any questions, ask your nurse or doctor.          esomeprazole 40 MG capsule Commonly known as: NEXIUM Take 1 capsule by mouth once daily        Birth History: non-contributory  Developmental History: non-contributory  Past Surgical History: Past Surgical History:  Procedure Laterality Date   LAPAROSCOPIC NISSEN FUNDOPLICATION  41/32/4401   Dr Jackolyn Confer   LOOP RECORDER INSERTION N/A 10/26/2016   Procedure: LOOP  RECORDER INSERTION;  Surgeon: Evans Lance, MD;  Location: [facility];  Service: Cardiovascular;  Laterality: N/A;   SVT ABLATION N/A 03/28/2018   Procedure: SVT ABLATION;  Surgeon: Evans Lance, MD;  Location: [facility];  Service: Cardiovascular;  Laterality: N/A;     Family History: Family History  Problem Relation Age of Onset   Breast cancer Mother    Colon cancer Neg Hx    Esophageal cancer Neg Hx    Pancreatic cancer Neg Hx    Stomach cancer Neg Hx    Liver disease Neg Hx      Social History: Jomel lives at home with his wife.  They live in a house that was built in 1973.  There is hardwood throughout the home.  He has gas heating and central cooling.  There are no animals inside or outside of the home.  There are no dust mite covers  on the bedding.  There is no tobacco exposure.  He does not use a HEPA filter.  He does not live near an interstate or industrial area.   Review of Systems  Constitutional: Negative.  Negative for fever, malaise/fatigue and weight loss.  HENT:  Positive for congestion. Negative for ear discharge and ear pain.        Positive for postnasal drip.  Eyes:  Negative for pain, discharge and redness.  Respiratory:  Negative for cough, sputum production, shortness of breath and wheezing.   Cardiovascular: Negative.  Negative for chest pain and palpitations.  Gastrointestinal:  Negative for abdominal pain, heartburn, nausea and vomiting.  Skin: Negative.  Negative for itching and rash.  Neurological:  Negative for dizziness and headaches.  Endo/Heme/Allergies:  Positive for environmental allergies. Does not bruise/bleed easily.      Objective:   Blood pressure 124/76, pulse 71, temperature 97.6 F (36.4 C), resp. rate 18, height 6\' 2"  (1.88 m), weight 205 lb 3.2 oz (93.1 kg), SpO2 97 %. Body mass index is 26.35 kg/m.   Physical Exam:   Physical Exam Vitals reviewed.  Constitutional:      Appearance: He is  well-developed.  HENT:     Head: Normocephalic and atraumatic.     Right Ear: Tympanic membrane, ear canal and external ear normal. No drainage, swelling or tenderness. Tympanic membrane is not injected, scarred, erythematous, retracted or bulging.     Left Ear: Tympanic membrane, ear canal and external ear normal. No drainage, swelling or tenderness. Tympanic membrane is not injected, scarred, erythematous, retracted or bulging.     Nose: No nasal deformity, septal deviation, mucosal edema or rhinorrhea.     Right Turbinates: Enlarged and swollen.     Left Turbinates: Enlarged and swollen.     Right Sinus: No maxillary sinus tenderness or frontal sinus tenderness.     Left Sinus: No maxillary sinus tenderness or frontal sinus tenderness.     Comments: Mild to moderate clear rhinorrhea.    Mouth/Throat:     Mouth: Mucous membranes are not pale and not dry.     Pharynx: Uvula midline.  Eyes:     General:        Right eye: No discharge.        Left eye: No discharge.     Conjunctiva/sclera: Conjunctivae normal.     Right eye: Right conjunctiva is not injected. No chemosis.    Left eye: Left conjunctiva is not injected. No chemosis.    Pupils: Pupils are equal, round, and reactive to light.  Cardiovascular:     Rate and Rhythm: Normal rate and regular rhythm.     Heart sounds: Normal heart sounds.  Pulmonary:     Effort: Pulmonary effort is normal. No tachypnea, accessory muscle usage or respiratory distress.     Breath sounds: Normal breath sounds. No wheezing, rhonchi or rales.  Chest:     Chest wall: No tenderness.  Lymphadenopathy:     Head:     Right side of head: No submandibular, tonsillar or occipital adenopathy.     Left side of head: No submandibular, tonsillar or occipital adenopathy.     Cervical: No cervical adenopathy.  Skin:    Coloration: Skin is not pale.     Findings: No abrasion, erythema, petechiae or rash. Rash is not papular, urticarial or vesicular.   Neurological:     Mental Status: He is alert.  Psychiatric:        Behavior: Behavior is  cooperative.     Diagnostic studies:   Allergy Studies:     Airborne Adult Perc - 08/09/20 0940     Time Antigen Placed 0941    Allergen Manufacturer Lavella Hammock    Location Back    Number of Test 59    1. Control-Buffer 50% Glycerol Negative    2. Control-Histamine 1 mg/ml 2+    3. Albumin saline Negative    4. Merrill Negative    5. [place] Negative    6. Johnson Negative    7. [place] Blue Negative    8. Meadow Fescue Negative    9. Perennial Rye Negative    10. Sweet Vernal Negative    11. Timothy Negative    12. Cocklebur Negative    13. Burweed Marshelder Negative    14. Ragweed, short Negative    15. Ragweed, Giant Negative    16. Plantain,  English Negative    17. Lamb's Quarters Negative    18. Sheep Sorrell Negative    19. Rough Pigweed Negative    20. Marsh Elder, Rough Negative    21. Mugwort, Common Negative    22. Ash mix 3+    23. Birch mix Negative    24. Beech American 2+    25. Box, Elder 2+    26. Cedar, red Negative    27. Cottonwood, [place] Negative    28. Elm mix 2+    29. Hickory Negative    30. Maple mix 2+    31. Oak, [place] mix Negative    32. Pecan Pollen Negative    33. Pine mix Negative    34. Sycamore [place] --   +/-   35. [employer], Black Pollen --   +/-   36. Alternaria alternata Negative    37. Cladosporium Herbarum Negative    38. Aspergillus mix Negative    39. Penicillium mix Negative    40. Bipolaris sorokiniana (Helminthosporium) Negative    41. Drechslera spicifera (Curvularia) Negative    42. Mucor plumbeus Negative    43. Fusarium moniliforme Negative    44. Aureobasidium pullulans (pullulara) Negative    45. Rhizopus oryzae Negative    46. Botrytis cinera Negative    47. Epicoccum nigrum Negative    48. Phoma betae Negative    49. Candida Albicans Negative    50. Trichophyton mentagrophytes Negative    51. Mite, D Farinae   5,000 AU/ml 3+    52. Mite, D Pteronyssinus  5,000 AU/ml Negative    53. Cat Hair 10,000 BAU/ml 2+    54.  Dog Epithelia Negative    55. Mixed Feathers Negative    56. Horse Epithelia Negative    57. Cockroach, German Negative    58. Mouse Negative    59. Tobacco Leaf Negative             Intradermal - 08/09/20 1004     Time Antigen Placed 1000    Allergen Manufacturer Greer    Location Arm    Number of Test 12    Control Negative    Gu[place] Negative    Johnson Negative    7 Grass Negative    Ragweed mix 3+    Weed mix 2+    Mold 1 1+    Mold 2 1+    Mold 3 2+    Mold 4 3+    Dog 3+    Cockroach 3+             Allergy  testing results were read and interpreted by myself, documented by clinical staff.         Salvatore Marvel, MD [facility]

## 2020-08-09 NOTE — Progress Notes (Signed)
Aeroallergen Immunotherapy   Ordering Provider: Dr. Salvatore Marvel   Patient Details  Name: Calvin Conner  MRN: [id number]  Date of Birth: 1955/10/03   Order 2 of 2   Vial Label: RW/DM/Molds/CR   0.3 ml (Volume)  1:20 Concentration -- Ragweed Mix  0.2 ml (Volume)  1:20 Concentration -- Alternaria alternata  0.2 ml (Volume)  1:20 Concentration -- Cladosporium herbarum  0.2 ml (Volume)  1:10 Concentration -- Aspergillus mix  0.2 ml (Volume)  1:10 Concentration -- Penicillium mix  0.2 ml (Volume)  1:20 Concentration -- Bipolaris sorokiniana  0.2 ml (Volume)  1:20 Concentration -- Drechslera spicifera  0.2 ml (Volume)  1:10 Concentration -- Mucor plumbeus  0.2 ml (Volume)  1:10 Concentration -- Fusarium moniliforme  0.2 ml (Volume)  1:40 Concentration -- Aureobasidium pullulans  0.2 ml (Volume)  1:10 Concentration -- Rhizopus oryzae  0.3 ml (Volume)  1:20 Concentration -- Cockroach, German  0.5 ml (Volume)   AU Concentration -- Mite Mix (DF 5,000 & DP 5,000)    3.1  ml Extract Subtotal  1.9  ml Diluent  5.0  ml Maintenance Total   Schedule:  A  Silver Vial (1:1,000,000):  Blue Vial (1:100,000): Schedule A (10 doses)  Yellow Vial (1:10,000): Schedule A (10 doses)  Green Vial (1:1,000): Schedule A (10 doses)  Red Vial (1:100): Schedule A (10 doses)   Special Instructions: patient does NOT tolerate antihistamines, so he will not be premedicating.

## 2020-08-09 NOTE — Patient Instructions (Addendum)
1. Perennial and seasonal allergic rhinitis - Testing today showed: ragweed, weeds, trees, indoor molds, outdoor molds, dust mites, cat, dog, and cockroach - Copy of test results provided.  - Avoidance measures provided. - Continue with: nasal saline rinses daily - We will schedule you to start allergy shots. - As you know, this is a 3-5 year commitment.  - Consider allergy shots as a means of long-term control. - Allergy shots "re-train" and "reset" the immune system to ignore environmental allergens and decrease the resulting immune response to those allergens (sneezing, itchy watery eyes, runny nose, nasal congestion, etc).    - Allergy shots improve symptoms in 75-85% of patients.   2. Return in about 6 months (around 02/09/2021).    Please inform us of any Emergency Department visits, hospitalizations, or changes in symptoms. Call us before going to the ED for breathing or allergy symptoms since we might be able to fit you in for a sick visit. Feel free to contact us anytime with any questions, problems, or concerns.  It was a pleasure to meet you today!  Websites that have reliable patient information: 1. American Academy of Asthma, Allergy, and Immunology: www.aaaai.org 2. Food Allergy Research and Education (FARE): foodallergy.org 3. Mothers of Asthmatics: http://www.asthmacommunitynetwork.org 4. American College of Allergy, Asthma, and Immunology: www.acaai.org   COVID-19 Vaccine Information can be found at: [email or web address] For questions related to vaccine distribution or appointments, please email [email or web address] or call [phone].   We realize that you might be concerned about having an allergic reaction to the COVID19 vaccines. To help with that concern, WE ARE OFFERING THE COVID19 VACCINES IN OUR OFFICE! Ask the front desk for dates!     "Like" [place] on Facebook and Instagram for our latest updates!       A healthy democracy works best when [employer] participate! Make sure you are registered to vote! If you have moved or changed any of your contact information, you will need to get this updated before voting!  In some cases, you MAY be able to register to vote online: [email or web address]     [place] [place]! Election day is Tuesday July 26th!     Reducing Pollen Exposure  The American Academy of Allergy, Asthma and Immunology suggests the following steps to reduce your exposure to pollen during allergy seasons.    Do not hang sheets or clothing out to dry; pollen may collect on these items. Do not mow lawns or spend time around freshly cut grass; mowing stirs up pollen. Keep windows closed at night.  Keep car windows closed while driving. Minimize morning activities outdoors, a time when pollen counts are usually at their highest. Stay indoors as much as possible when pollen counts or humidity is high and on windy days when pollen tends to remain in the air longer. Use air conditioning when possible.  Many air conditioners have filters that trap the pollen spores. Use a HEPA room air filter to remove pollen form the indoor air you breathe.  Control of Mold Allergen   Mold and fungi can grow on a variety of surfaces provided certain temperature and moisture conditions exist.  Outdoor molds grow on plants, decaying vegetation and soil.  The major outdoor mold, Alternaria and Cladosporium, are found in very high numbers during hot and dry conditions.  Generally, a late Summer - Fall peak is seen for common outdoor fungal spores.  Rain will temporarily lower outdoor mold spore count, but counts rise  rapidly when the rainy period ends.  The most important indoor molds are Aspergillus and Penicillium.  Dark, humid and poorly ventilated basements are ideal sites for mold growth.  The next most common sites of mold  growth are the bathroom and the kitchen.  Outdoor (Seasonal) Mold Control  Positive outdoor molds via skin testing: Alternaria, Cladosporium, Bipolaris (Helminthsporium), Drechslera (Curvalaria), and Mucor  Use air conditioning and keep windows closed Avoid exposure to decaying vegetation. Avoid leaf raking. Avoid grain handling. Consider wearing a face mask if working in moldy areas.    Indoor (Perennial) Mold Control   Positive indoor molds via skin testing: Aspergillus, Penicillium, Fusarium, Aureobasidium (Pullulara), and Rhizopus  Maintain humidity below 50%. Clean washable surfaces with 5% bleach solution. Remove sources e.g. contaminated carpets.    Control of Dog or Cat Allergen  Avoidance is the best way to manage a dog or cat allergy. If you have a dog or cat and are allergic to dog or cats, consider removing the dog or cat from the home. If you have a dog or cat but don't want to find it a new home, or if your family wants a pet even though someone in the household is allergic, here are some strategies that may help keep symptoms at bay:  Keep the pet out of your bedroom and restrict it to only a few rooms. Be advised that keeping the dog or cat in only one room will not limit the allergens to that room. Don't pet, hug or kiss the dog or cat; if you do, wash your hands with soap and water. High-efficiency particulate air (HEPA) cleaners run continuously in a bedroom or living room can reduce allergen levels over time. Regular use of a high-efficiency vacuum cleaner or a central vacuum can reduce allergen levels. Giving your dog or cat a bath at least once a week can reduce airborne allergen.  Control of Dust Mite Allergen    Dust mites play a major role in allergic asthma and rhinitis.  They occur in environments with high humidity wherever human skin is found.  Dust mites absorb humidity from the atmosphere (ie, they do not drink) and feed on organic matter  (including shed human and animal skin).  Dust mites are a microscopic type of insect that you cannot see with the naked eye.  High levels of dust mites have been detected from mattresses, pillows, carpets, upholstered furniture, bed covers, clothes, soft toys and any woven material.  The principal allergen of the dust mite is found in its feces.  A gram of dust may contain 1,000 mites and 250,000 fecal particles.  Mite antigen is easily measured in the air during house cleaning activities.  Dust mites do not bite and do not cause harm to humans, other than by triggering allergies/asthma.    Ways to decrease your exposure to dust mites in your home:  Encase mattresses, box springs and pillows with a mite-impermeable barrier or cover   Wash sheets, blankets and drapes weekly in hot water (130 F) with detergent and dry them in a dryer on the hot setting.  Have the room cleaned frequently with a vacuum cleaner and a damp dust-mop.  For carpeting or rugs, vacuuming with a vacuum cleaner equipped with a high-efficiency particulate air (HEPA) filter.  The dust mite allergic individual should not be in a room which is being cleaned and should wait 1 hour after cleaning before going into the room. Do not sleep on upholstered furniture (eg,  couches).   If possible removing carpeting, upholstered furniture and drapery from the home is ideal.  Horizontal blinds should be eliminated in the rooms where the person spends the most time (bedroom, study, television room).  Washable vinyl, roller-type shades are optimal. Remove all non-washable stuffed toys from the bedroom.  Wash stuffed toys weekly like sheets and blankets above.   Reduce indoor humidity to less than 50%.  Inexpensive humidity monitors can be purchased at most hardware stores.  Do not use a humidifier as can make the problem worse and are not recommended.  Control of Cockroach Allergen  Cockroach allergen has been identified as an important cause of  acute attacks of asthma, especially in urban settings.  There are fifty-five species of cockroach that exist in the [place], however only three, the [place], [place] species produce allergen that can affect patients with Asthma.  Allergens can be obtained from fecal particles, egg casings and secretions from cockroaches.    Remove food sources. Reduce access to water. Seal access and entry points. Spray runways with 0.5-1% Diazinon or Chlorpyrifos Blow boric acid power under stoves and refrigerator. Place bait stations (hydramethylnon) at feeding sites.  Allergy Shots   Allergies are the result of a chain reaction that starts in the immune system. Your immune system controls how your body defends itself. For instance, if you have an allergy to pollen, your immune system identifies pollen as an invader or allergen. Your immune system overreacts by producing antibodies called Immunoglobulin E (IgE). These antibodies travel to cells that release chemicals, causing an allergic reaction.  The concept behind allergy immunotherapy, whether it is received in the form of shots or tablets, is that the immune system can be desensitized to specific allergens that trigger allergy symptoms. Although it requires time and patience, the payback can be long-term relief.  How Do Allergy Shots Work?  Allergy shots work much like a vaccine. Your body responds to injected amounts of a particular allergen given in increasing doses, eventually developing a resistance and tolerance to it. Allergy shots can lead to decreased, minimal or no allergy symptoms.  There generally are two phases: build-up and maintenance. Build-up often ranges from three to six months and involves receiving injections with increasing amounts of the allergens. The shots are typically given once or twice a week, though more rapid build-up schedules are sometimes used.  The maintenance phase begins when the most effective dose  is reached. This dose is different for each person, depending on how allergic you are and your response to the build-up injections. Once the maintenance dose is reached, there are longer periods between injections, typically two to four weeks.  Occasionally doctors give cortisone-type shots that can temporarily reduce allergy symptoms. These types of shots are different and should not be confused with allergy immunotherapy shots.  Who Can Be Treated with Allergy Shots?  Allergy shots may be a good treatment approach for people with allergic rhinitis (hay fever), allergic asthma, conjunctivitis (eye allergy) or stinging insect allergy.   Before deciding to begin allergy shots, you should consider:   The length of allergy season and the severity of your symptoms  Whether medications and/or changes to your environment can control your symptoms  Your desire to avoid long-term medication use  Time: allergy immunotherapy requires a major time commitment  Cost: may vary depending on your insurance coverage  Allergy shots for children age 35 and older are effective and often well tolerated. They might prevent the  onset of new allergen sensitivities or the progression to asthma.  Allergy shots are not started on patients who are pregnant but can be continued on patients who become pregnant while receiving them. In some patients with other medical conditions or who take certain common medications, allergy shots may be of risk. It is important to mention other medications you talk to your allergist.   When Will I Feel Better?  Some may experience decreased allergy symptoms during the build-up phase. For others, it may take as long as 12 months on the maintenance dose. If there is no improvement after a year of maintenance, your allergist will discuss other treatment options with you.  If you aren't responding to allergy shots, it may be because there is not enough dose of the allergen in your vaccine  or there are missing allergens that were not identified during your allergy testing. Other reasons could be that there are high levels of the allergen in your environment or major exposure to non-allergic triggers like tobacco smoke.  What Is the Length of Treatment?  Once the maintenance dose is reached, allergy shots are generally continued for three to five years. The decision to stop should be discussed with your allergist at that time. Some people may experience a permanent reduction of allergy symptoms. Others may relapse and a longer course of allergy shots can be considered.  What Are the Possible Reactions?  The two types of adverse reactions that can occur with allergy shots are local and systemic. Common local reactions include very mild redness and swelling at the injection site, which can happen immediately or several hours after. A systemic reaction, which is less common, affects the entire body or a particular body system. They are usually mild and typically respond quickly to medications. Signs include increased allergy symptoms such as sneezing, a stuffy nose or hives.  Rarely, a serious systemic reaction called anaphylaxis can develop. Symptoms include swelling in the throat, wheezing, a feeling of tightness in the chest, nausea or dizziness. Most serious systemic reactions develop within 30 minutes of allergy shots. This is why it is strongly recommended you wait in your doctor's office for 30 minutes after your injections. Your allergist is trained to watch for reactions, and his or her staff is trained and equipped with the proper medications to identify and treat them.  Who Should Administer Allergy Shots?  The preferred location for receiving shots is your prescribing allergist's office. Injections can sometimes be given at another facility where the physician and staff are trained to recognize and treat reactions, and have received instructions by your prescribing  allergist.

## 2020-08-09 NOTE — Progress Notes (Signed)
VIALS MADE. EXP 08-09-21

## 2020-08-10 DIAGNOSIS — J3081 Allergic rhinitis due to animal (cat) (dog) hair and dander: Secondary | ICD-10-CM | POA: Diagnosis not present

## 2020-08-11 DIAGNOSIS — J3089 Other allergic rhinitis: Secondary | ICD-10-CM | POA: Diagnosis not present

## 2020-09-05 ENCOUNTER — Other Ambulatory Visit: Payer: Self-pay

## 2020-09-05 ENCOUNTER — Ambulatory Visit (INDEPENDENT_AMBULATORY_CARE_PROVIDER_SITE_OTHER): Payer: Medicare Other

## 2020-09-05 DIAGNOSIS — J309 Allergic rhinitis, unspecified: Secondary | ICD-10-CM | POA: Diagnosis not present

## 2020-09-05 NOTE — Progress Notes (Signed)
Patient came in today and started his immunotherapy. He was Weed-Tree-Cat-Dog in his  Left Upper Arm. In his Right upper arm he received Ragweed-Dust Mist-Mold-Cockroach. In both arms he got 0.05 and was put on a schedule A for weekly injections. Patient was with his wife and wanted to wait outside for the thirty minutes. Patient stated that he did sign consent prior and received his Epipen and was taught how to use it. Patient also stated that this was not the first time he was on immunotherapy and did it many years ago as an adolescent. Patient came back after thirty minutes and had no issues or itching.

## 2020-09-12 ENCOUNTER — Ambulatory Visit (INDEPENDENT_AMBULATORY_CARE_PROVIDER_SITE_OTHER): Payer: Medicare Other

## 2020-09-12 DIAGNOSIS — J309 Allergic rhinitis, unspecified: Secondary | ICD-10-CM | POA: Diagnosis not present

## 2020-09-19 ENCOUNTER — Ambulatory Visit (INDEPENDENT_AMBULATORY_CARE_PROVIDER_SITE_OTHER): Payer: Medicare Other

## 2020-09-19 DIAGNOSIS — J309 Allergic rhinitis, unspecified: Secondary | ICD-10-CM | POA: Diagnosis not present

## 2020-09-27 ENCOUNTER — Ambulatory Visit (INDEPENDENT_AMBULATORY_CARE_PROVIDER_SITE_OTHER): Payer: Medicare Other

## 2020-09-27 DIAGNOSIS — J309 Allergic rhinitis, unspecified: Secondary | ICD-10-CM | POA: Diagnosis not present

## 2020-10-03 ENCOUNTER — Ambulatory Visit (INDEPENDENT_AMBULATORY_CARE_PROVIDER_SITE_OTHER): Payer: Medicare Other | Admitting: *Deleted

## 2020-10-03 DIAGNOSIS — J309 Allergic rhinitis, unspecified: Secondary | ICD-10-CM | POA: Diagnosis not present

## 2020-10-06 ENCOUNTER — Other Ambulatory Visit: Payer: Self-pay | Admitting: Internal Medicine

## 2020-10-06 DIAGNOSIS — R109 Unspecified abdominal pain: Secondary | ICD-10-CM

## 2020-10-24 ENCOUNTER — Ambulatory Visit
Admission: RE | Admit: 2020-10-24 | Discharge: 2020-10-24 | Disposition: A | Discharge status: Home / Self Care | Payer: Medicare Other | Source: Ambulatory Visit | Attending: Internal Medicine | Admitting: Internal Medicine

## 2020-10-24 DIAGNOSIS — R109 Unspecified abdominal pain: Secondary | ICD-10-CM

## 2020-10-24 MED ORDER — IOPAMIDOL (ISOVUE-300) INJECTION 61%
100.0000 mL | Freq: Once | INTRAVENOUS | Status: AC | PRN
  Administered 2020-10-24: 100 mL via INTRAVENOUS

## 2020-11-29 ENCOUNTER — Encounter (HOSPITAL_BASED_OUTPATIENT_CLINIC_OR_DEPARTMENT_OTHER): Payer: Self-pay

## 2020-11-29 DIAGNOSIS — G4733 Obstructive sleep apnea (adult) (pediatric): Secondary | ICD-10-CM

## 2020-12-20 ENCOUNTER — Other Ambulatory Visit: Payer: Self-pay

## 2020-12-20 ENCOUNTER — Ambulatory Visit (HOSPITAL_BASED_OUTPATIENT_CLINIC_OR_DEPARTMENT_OTHER): Payer: Medicare Other | Attending: Internal Medicine | Admitting: Internal Medicine

## 2020-12-20 DIAGNOSIS — G4733 Obstructive sleep apnea (adult) (pediatric): Secondary | ICD-10-CM

## 2021-01-04 ENCOUNTER — Ambulatory Visit (HOSPITAL_BASED_OUTPATIENT_CLINIC_OR_DEPARTMENT_OTHER): Payer: Medicare Other | Attending: Internal Medicine | Admitting: Internal Medicine

## 2021-01-04 DIAGNOSIS — G4733 Obstructive sleep apnea (adult) (pediatric): Secondary | ICD-10-CM

## 2021-01-13 DIAGNOSIS — G4733 Obstructive sleep apnea (adult) (pediatric): Secondary | ICD-10-CM | POA: Diagnosis not present

## 2021-01-13 NOTE — Procedures (Signed)
° ° ° °  Patient Name: Calvin Conner, Calvin Conner Date: 01/04/2021 Gender: Male D.O.B: 10-27-1955 Age (years): 71 Referring Provider: Latanya Presser MD Height (inches): 48 Interpreting Physician: Baird Lyons MD, ABSM Weight (lbs): 200 RPSGT: Jacolyn Reedy BMI: 26 MRN: [id number] Neck Size: <br>  CLINICAL INFORMATION Sleep Study Type: HST Indication for sleep study: OSA Epworth Sleepiness Score: 15  SLEEP STUDY TECHNIQUE A multi-channel overnight portable sleep study was performed. The channels recorded were: nasal airflow, thoracic respiratory movement, and oxygen saturation with a pulse oximetry. Snoring was also monitored.  MEDICATIONS Patient self administered medications include: none reported.  SLEEP ARCHITECTURE Patient was studied for 364.7 minutes. The sleep efficiency was 100.0 % and the patient was supine for 65.8%. The arousal index was 0.0 per hour.  RESPIRATORY PARAMETERS The overall AHI was 19.6 per hour, with a central apnea index of 0 per hour. The oxygen nadir was 86% during sleep.  CARDIAC DATA Mean heart rate during sleep was 61.0 bpm.  IMPRESSIONS - Moderate obstructive sleep apnea occurred during this study (AHI = 19.6/h). - Moderate oxygen desaturation was noted during this study (Min O2 = 86%). Mean O2 swaturation 94%. - Patient snored.  DIAGNOSIS - Obstructive Sleep Apnea (G47.33)  RECOMMENDATIONS - Suggest CPAP titration sleep study or autopap. Other options wopuld be based on clinical judgment. - Be careful with alcohol, sedatives and other CNS depressants that may worsen sleep apnea and disrupt normal sleep architecture. - Sleep hygiene should be reviewed to assess factors that may improve sleep quality. - Weight management and regular exercise should be initiated or continued.  [Electronically signed] 01/13/2021 02:54 PM  Baird Lyons MD, [place], American Board of Sleep Medicine   NPI: [id number]                         [place], [place] [facility]  ELECTRONICALLY SIGNED ON:  01/13/2021, 2:55 PM [place] PH: [phone]   FX: [phone] [place]

## 2021-02-09 ENCOUNTER — Ambulatory Visit: Payer: Self-pay | Admitting: Allergy & Immunology

## 2022-09-06 ENCOUNTER — Encounter (INDEPENDENT_AMBULATORY_CARE_PROVIDER_SITE_OTHER): Payer: Medicare Other | Admitting: Ophthalmology

## 2022-09-06 DIAGNOSIS — H2513 Age-related nuclear cataract, bilateral: Secondary | ICD-10-CM

## 2022-09-06 DIAGNOSIS — H43813 Vitreous degeneration, bilateral: Secondary | ICD-10-CM | POA: Diagnosis not present

## 2022-09-06 DIAGNOSIS — H4421 Degenerative myopia, right eye: Secondary | ICD-10-CM | POA: Diagnosis not present

## 2022-09-06 DIAGNOSIS — H35372 Puckering of macula, left eye: Secondary | ICD-10-CM

## 2022-11-09 ENCOUNTER — Encounter: Payer: Self-pay | Admitting: Internal Medicine

## 2023-08-23 NOTE — Procedures (Signed)
Result scanned to media

## 2023-11-13 ENCOUNTER — Other Ambulatory Visit: Payer: Self-pay | Admitting: Internal Medicine

## 2023-11-13 DIAGNOSIS — H532 Diplopia: Secondary | ICD-10-CM

## 2023-12-15 ENCOUNTER — Other Ambulatory Visit

## 2024-02-01 ENCOUNTER — Emergency Department (HOSPITAL_BASED_OUTPATIENT_CLINIC_OR_DEPARTMENT_OTHER)
Admission: EM | Admit: 2024-02-01 | Discharge: 2024-02-01 | Disposition: A | Discharge status: Home / Self Care | Attending: Emergency Medicine | Admitting: Emergency Medicine

## 2024-02-01 ENCOUNTER — Encounter (HOSPITAL_BASED_OUTPATIENT_CLINIC_OR_DEPARTMENT_OTHER): Payer: Self-pay

## 2024-02-01 ENCOUNTER — Other Ambulatory Visit: Payer: Self-pay

## 2024-02-01 ENCOUNTER — Emergency Department (HOSPITAL_BASED_OUTPATIENT_CLINIC_OR_DEPARTMENT_OTHER)

## 2024-02-01 DIAGNOSIS — H53131 Sudden visual loss, right eye: Secondary | ICD-10-CM | POA: Diagnosis present

## 2024-02-01 DIAGNOSIS — G453 Amaurosis fugax: Secondary | ICD-10-CM | POA: Insufficient documentation

## 2024-02-01 LAB — CBC WITH DIFFERENTIAL/PLATELET
Abs Immature Granulocytes: 0.01 K/uL (ref 0.00–0.07)
Basophils Absolute: 0 K/uL (ref 0.0–0.1)
Basophils Relative: 1 %
Eosinophils Absolute: 0 K/uL (ref 0.0–0.5)
Eosinophils Relative: 1 %
HCT: 42.5 % (ref 39.0–52.0)
Hemoglobin: 14.8 g/dL (ref 13.0–17.0)
Immature Granulocytes: 0 %
Lymphocytes Relative: 28 %
Lymphs Abs: 1.2 K/uL (ref 0.7–4.0)
MCH: 30.9 pg (ref 26.0–34.0)
MCHC: 34.8 g/dL (ref 30.0–36.0)
MCV: 88.7 fL (ref 80.0–100.0)
Monocytes Absolute: 0.3 K/uL (ref 0.1–1.0)
Monocytes Relative: 7 %
Neutro Abs: 2.9 K/uL (ref 1.7–7.7)
Neutrophils Relative %: 63 %
Platelets: 178 K/uL (ref 150–400)
RBC: 4.79 MIL/uL (ref 4.22–5.81)
RDW: 12.9 % (ref 11.5–15.5)
WBC: 4.4 K/uL (ref 4.0–10.5)
nRBC: 0 % (ref 0.0–0.2)

## 2024-02-01 LAB — COMPREHENSIVE METABOLIC PANEL WITH GFR
ALT: 26 U/L (ref 0–44)
AST: 22 U/L (ref 15–41)
Albumin: 4.5 g/dL (ref 3.5–5.0)
Alkaline Phosphatase: 57 U/L (ref 38–126)
Anion gap: 11 (ref 5–15)
BUN: 17 mg/dL (ref 8–23)
CO2: 24 mmol/L (ref 22–32)
Calcium: 9.8 mg/dL (ref 8.9–10.3)
Chloride: 104 mmol/L (ref 98–111)
Creatinine, Ser: 1 mg/dL (ref 0.61–1.24)
GFR, Estimated: >60 mL/min
Glucose, Bld: 106 mg/dL — ABNORMAL HIGH (ref 70–99)
Potassium: 3.9 mmol/L (ref 3.5–5.1)
Sodium: 139 mmol/L (ref 135–145)
Total Bilirubin: 0.5 mg/dL (ref 0.0–1.2)
Total Protein: 7.6 g/dL (ref 6.5–8.1)

## 2024-02-01 LAB — SEDIMENTATION RATE: Sed Rate: 12 mm/h (ref 0–16)

## 2024-02-01 MED ORDER — IOHEXOL 350 MG/ML SOLN
75.0000 mL | Freq: Once | INTRAVENOUS | Status: AC | PRN
  Administered 2024-02-01: 75 mL via INTRAVENOUS

## 2024-02-01 MED ORDER — TETRACAINE HCL 0.5 % OP SOLN
2.0000 [drp] | Freq: Once | OPHTHALMIC | Status: AC
  Administered 2024-02-01: 2 [drp] via OPHTHALMIC
  Filled 2024-02-01: qty 4

## 2024-02-01 NOTE — Discharge Instructions (Signed)
 You were seen in the emergency department for transient loss of vision in the right eye We are not certain what caused this but at this time there is no evidence of stroke or ocular emergency We have provided you with a referral to neurology to be seen in the office.  They should call you within 72 hours You should also follow-up with Dr. Valdemar in the ophthalmology office within the next week to have your eye evaluated by a specialist Return to the Emergency Department for loss of vision headaches weakness on one side of your body or any other concerns for stroke

## 2024-02-01 NOTE — ED Triage Notes (Signed)
 Patient states episode of blindness in right eye around 3:30 this morning. Vision quickly returned. Denies any symptoms currently.

## 2024-02-01 NOTE — ED Provider Notes (Signed)
 " [place] [facility] AT [facility] Provider Note   CSN: [id number] Arrival date & time: 02/01/24  9176     Patient presents with: Loss of Vision   Calvin Conner is a 69 y.o. male.  With a history of of SVT status post ablation (2020) who presents to the ED for loss of vision.  Patient woke around 0330 this morning with painless vision loss in the right eye.  He describes the vision loss as complete except for a small circle of light in the central field of vision.  This lasted for approximately 30 minutes before resolving spontaneously and has not recurred.  The patient reports his vision has returned to normal baseline currently.  He has never experienced similar symptoms in the past.  No other coinciding symptoms.  Denies headaches nausea vomiting fevers chills recent illness chest pain shortness of breath focal deficits.   HPI     Prior to Admission medications  Medication Sig Start Date End Date Taking? Authorizing Provider  esomeprazole  (NEXIUM ) 40 MG capsule Take 1 capsule by mouth once daily Patient not taking: Reported on 08/09/2020 10/01/19   Aneita Gwendlyn DASEN, MD    Allergies: Penicillins    Review of Systems  Updated Vital Signs BP (!) 144/90   Pulse 79   Temp 98.3 F (36.8 C) (Oral)   Resp 16   Ht 6' 2 (1.88 m)   Wt 90.7 kg   SpO2 97%   BMI 25.67 kg/m   Physical Exam Vitals and nursing note reviewed.  HENT:     Head: Normocephalic and atraumatic.  Eyes:     Extraocular Movements: Extraocular movements intact.     Conjunctiva/sclera: Conjunctivae normal.     Pupils: Pupils are equal, round, and reactive to light.  Cardiovascular:     Rate and Rhythm: Normal rate and regular rhythm.  Pulmonary:     Effort: Pulmonary effort is normal.     Breath sounds: Normal breath sounds.  Skin:    General: Skin is warm and dry.  Neurological:     Mental Status: He is alert.  Psychiatric:        Mood and Affect: Mood normal.     (all labs  ordered are listed, but only abnormal results are displayed) Labs Reviewed  COMPREHENSIVE METABOLIC PANEL WITH GFR - Abnormal; Notable for the following components:      Result Value   Glucose, Bld 106 (*)    All other components within normal limits  CBC WITH DIFFERENTIAL/PLATELET  SEDIMENTATION RATE    EKG: None  Radiology: CT ANGIO HEAD NECK W WO CM Result Date: 02/01/2024 EXAM: CTA Head and Neck with Intravenous Contrast. CT Head without Contrast. CLINICAL HISTORY: 69 year old male. Cerebral vasospasm suspected; transient right eye vision loss. Transient right eye blindness 0330 hours. TECHNIQUE: Axial CTA images of the head and neck performed with and without intravenous contrast. MIP reconstructed images were created and reviewed. Axial computed tomography images of the head/brain performed without intravenous contrast. Note: Per PQRS, the description of internal carotid artery percent stenosis, including 0 percent or normal exam, is based on North American Symptomatic Carotid Endarterectomy Trial (NASCET) criteria. Dose reduction technique was used including one or more of the following: automated exposure control, adjustment of mA and kV according to patient size, and/or iterative reconstruction. CONTRAST: Without and with intravenous contrast. 75 mL (iohexol  (OMNIPAQUE ) 350 MG/ML injection 75 mL IOHEXOL  350 MG/ML SOLN). COMPARISON: Brain MRI 11/07/2010. FINDINGS: CT HEAD: BRAIN: No acute  intraparenchymal hemorrhage. No mass lesion. No CT evidence for acute territorial infarct. No midline shift or extra-axial collection. Brain volume normal for age and not significantly changed. Normal for age gray white differentiation. VENTRICLES: No hydrocephalus. ORBITS: Normal suprasellar cistern. The orbits are unremarkable. SINUSES AND MASTOIDS: Paranasal sinuses, tympanic cavities, and mastoids are well aerated. SOFT TISSUES: Soft tissues appear symmetric and normal. BONES: No acute osseous  abnormality. Very mild for age spine degeneration. CTA NECK: Mild aortic arch tortuosity, normal 3 vessel arch configuration. COMMON CAROTID ARTERIES: No stenosis. No dissection or occlusion. INTERNAL CAROTID ARTERIES: Right ICA: Tortuous right ICA distal to the bulb. Minimal right carotid atherosclerosis, right ICA calcified plaque, without stenosis. Normal right ophthalmic artery origin and enhancement on series 10 image 108. Left ICA: Tortuous left ICA below the skull base. Minimal left carotid atherosclerosis limited to the left ICA siphon. No stenosis. Normal left ophthalmic artery origin and enhancement. No dissection or occlusion. Ophthalmic artery enhancement within the posterior orbits appears symmetric on series 11 image 73. VERTEBRAL ARTERIES: Normal vertebral artery origins. Right vertebral artery mildly dominant throughout. No vertebral artery atherosclerosis. No significant stenosis. No dissection or occlusion. Normal PICA origins. OTHER: Nonvascular neck soft tissue spaces are within normal limits; mild postinflammatory calcifications of the tonsillar pillars. Negative visible upper chest. CTA HEAD: ANTERIOR CEREBRAL ARTERIES: Diminutive or absent anterior communicating artery, normal variant. Mildly dominant left ACA and generalized mild to moderate ACA tortuosity. Normal ACA origins. No stenosis. No occlusion. No aneurysm. MIDDLE CEREBRAL ARTERIES: Normal MCA origins. No stenosis. No occlusion. No aneurysm. POSTERIOR CEREBRAL ARTERIES: Diminutive or absent posterior communicating arteries, normal variant. Normal PCA origins. No stenosis. No occlusion. No aneurysm. BASILAR ARTERY: No stenosis. No occlusion. No aneurysm. Normal SCA origins. INTRACRANIAL VENOUS: Early intracranial venous contrast timing. Superior sagittal sinus appears patent. IMPRESSION: 1. Normal for age CT appearance of the brain, orbits. 2. Normal CTA of the head and neck for age except for arterial tortuosity: Minimal  atherosclerosis and no stenosis. Electronically signed by: Helayne Hurst MD MD 02/01/2024 10:11 AM EST RP Workstation: [id number]     Procedures   Medications Ordered in the ED  tetracaine  (PONTOCAINE) 0.5 % ophthalmic solution 2 drop (2 drops Both Eyes Given 02/01/24 0858)  iohexol  (OMNIPAQUE ) 350 MG/ML injection 75 mL (75 mLs Intravenous Contrast Given 02/01/24 0950)    Clinical Course as of 02/01/24 1100  Sat Feb 01, 2024  1059 Laboratory workup including ESR unremarkable.  CTA head and neck shows no acute findings.  Ocular ultrasound shows no evidence of retinal detachment.  Will instruct for outpatient follow-up with neurology ophthalmology.  Return precautions that would be worrisome for ocular emergency or TIA/stroke were discussed in detail and patient articulated his understanding [MP]    Clinical Course User Index [MP] Pamella Ozell LABOR, DO                                 Medical Decision Making 69 year old male with history as above presenting for transient right eye vision loss.  Painless.  Lasted for about 30 minutes before resolving spontaneously.  No other neurologic symptoms.  External examination of right eye is unremarkable.  Normal intraocular pressures.  No evidence of vitreous or retinal detachment on bedside ultrasound exam.  Differential diagnosis includes central retinal vein occlusion, central retinal arterial occlusion, open-angle glaucoma temporal arteritis, optic neuritis and stroke.  Will obtain basic laboratory workup including ESR along with CT  CTA head and neck to evaluate for stroke.  Suspect if ED workup is negative he is safe for discharge with close neurology and ophthalmology follow-up.  Amount and/or Complexity of Data Reviewed Labs: ordered. Radiology: ordered.  Risk Prescription drug management.        Final diagnoses:  Amaurosis fugax of right eye    ED Discharge Orders          Ordered    Ambulatory referral to Neurology       Comments:  An appointment is requested in approximately: 2 weeks   02/01/24 1057               Pamella Ozell LABOR, DO 02/01/24 1100  "

## 2024-02-05 ENCOUNTER — Encounter (INDEPENDENT_AMBULATORY_CARE_PROVIDER_SITE_OTHER): Payer: Self-pay | Admitting: Ophthalmology

## 2024-02-05 ENCOUNTER — Ambulatory Visit (INDEPENDENT_AMBULATORY_CARE_PROVIDER_SITE_OTHER): Admitting: Ophthalmology

## 2024-02-05 DIAGNOSIS — H539 Unspecified visual disturbance: Secondary | ICD-10-CM

## 2024-02-05 DIAGNOSIS — H25813 Combined forms of age-related cataract, bilateral: Secondary | ICD-10-CM

## 2024-02-05 NOTE — Progress Notes (Signed)
 " [facility] - Clinic Note  02/05/2024   CHIEF COMPLAINT Patient presents for Retina Follow Up  HISTORY OF PRESENT ILLNESS: Calvin Conner is a 69 y.o. male who presents to the clinic today for:  HPI     Retina Follow Up   Patient presents with  Other.  In right eye.  Severity is moderate.  Duration of 4 days.  Since onset it is stable.  I, the attending physician,  performed the HPI with the patient and updated documentation appropriately.        Comments   4 day Retina eval. Patient states he woke up Saturday morning with blindness in the right eye. It lasted about a minute. Patient went to the ER and had CTA scan which was normal, had ultrasound of the eye which was also normal. Also had IOP checked and was normal. Patient referred here and to a Neuro. Patient states he has not been to the Neuro. Today vision seems normal. Last eye exam was with [facility] recently      Last edited by Valdemar Rogue, MD on 02/16/2024 11:22 PM.    Pt states on Saturday he had an episode of blindness in OD for about a minute. Went to the ER and had a stroke r/o. He woke up from his sleep and couldn't see. Said [place] in OD was completely dark except an oblong area of clarity in central [place]. Very small. This occurrence happened yesterday during the day. Darkness came from right side towards the middle, sparing central VA again. Saw some FOL centrally. Occurred x 1 min again then stopped. Hx of chronic fatigue syndrome, double vision, no HTN or hx of stroke. Had a cardiac ablation previously with no current issues. Pt is retired. Takes no meds.   Referring physician: Roanna Ezekiel NOVAK, MD [address] [place],  [place] [address]  HISTORICAL INFORMATION:  Selected notes from the MEDICAL RECORD NUMBER Referred for ED f/u following transient vision loss OD LEE:  Ocular Hx- PMH-   CURRENT MEDICATIONS: No current outpatient medications on file. (Ophthalmic Drugs)   No  current facility-administered medications for this visit. (Ophthalmic Drugs)   Current Outpatient Medications (Other)  Medication Sig   esomeprazole  (NEXIUM ) 40 MG capsule Take 1 capsule by mouth once daily (Patient not taking: Reported on 02/05/2024)   No current facility-administered medications for this visit. (Other)   REVIEW OF SYSTEMS: ROS   Positive for: Eyes Negative for: Constitutional, Gastrointestinal, Neurological, Skin, Genitourinary, Musculoskeletal, HENT, Endocrine, Cardiovascular, Respiratory, Psychiatric, Allergic/Imm, Heme/Lymph Last edited by German Olam BRAVO, COT on 02/05/2024  8:06 AM.     ALLERGIES Allergies[1] PAST MEDICAL HISTORY Past Medical History:  Diagnosis Date   Anxiety    Cardiac arrhythmia    Depression    Fibromyalgia    GERD (gastroesophageal reflux disease)    w/ LA Grade A erosive esophagitis   Hiatal hernia    IBS (irritable bowel syndrome)    Internal hemorrhoids    Past Surgical History:  Procedure Laterality Date   LAPAROSCOPIC NISSEN FUNDOPLICATION  11/19/2008   Dr Krystal Russell   LOOP RECORDER INSERTION N/A 10/26/2016   Procedure: LOOP RECORDER INSERTION;  Surgeon: Waddell Danelle ORN, MD;  Location: [facility] INVASIVE CV LAB;  Service: Cardiovascular;  Laterality: N/A;   SVT ABLATION N/A 03/28/2018   Procedure: SVT ABLATION;  Surgeon: Waddell Danelle ORN, MD;  Location: [facility] INVASIVE CV LAB;  Service: Cardiovascular;  Laterality: N/A;  FAMILY HISTORY Family History  Problem Relation Age of Onset   Breast cancer Mother    Colon cancer Neg Hx    Esophageal cancer Neg Hx    Pancreatic cancer Neg Hx    Stomach cancer Neg Hx    Liver disease Neg Hx    SOCIAL HISTORY Social History[2]     OPHTHALMIC EXAM:  Base Eye Exam     Visual Acuity (Snellen - Linear)       Right Left   Dist cc 20/25 -2 20/30   Dist ph cc 20/NI 20/NI    Correction: Glasses         Tonometry (Tonopen, 8:08 AM)       Right Left   Pressure 14 14          Pupils       Dark Light Shape React APD   Right 3 2 Round Brisk None   Left 3 2 Round Brisk None         Visual Fields (Counting fingers)       Left Right    Full Full         Extraocular Movement       Right Left    Full, Ortho Full, Ortho         Neuro/Psych     Oriented x3: Yes   Mood/Affect: Normal         Dilation     Both eyes: 1.0% Mydriacyl, 2.5% Phenylephrine @ 8:08 AM           Slit Lamp and Fundus Exam     Slit Lamp Exam       Right Left   Lids/Lashes Normal Normal   Conjunctiva/Sclera White and quiet White and quiet   Cornea Clear Mild tear film debris   Anterior Chamber Deep and Clear, narrow temporal angle Deep and Clear, narrow temporal angle   Iris Round and Dilated Round and Dilated   Lens 2-3+ Nuclear sclerosis, 2-3+ Cortical cataract 2-3+ Nuclear sclerosis, 2-3+ Cortical cataract   Anterior Vitreous mild syneresis mild syneresis         Fundus Exam       Right Left   Disc Pink and Sharp Pink and Sharp   C/D Ratio 0.3 0.4   Macula Flat, Good foveal reflex, RPE mottling, fine drusen nasal mac, no heme or edema Flat, Good foveal reflex, RPE mottling, fine drusen nasal mac, no heme or edema   Vessels Attenuated, Tortuous Attenuated, Tortuous   Periphery Attached, mild pavingstone degeneration inferiorly, no heme Attached, mild pavingstone degeneration inferiorly, no heme           Refraction     Wearing Rx       Sphere Cylinder Axis Add   Right -5.00 +2.25 001 +2.25   Left -4.75 +1.25 165 +2.25         Manifest Refraction (Auto)       Sphere Cylinder Axis Dist VA   Right -5.50 +2.50 005 20/20   Left -6.00 +1.75 001 20/20-1           IMAGING AND PROCEDURES  Imaging and Procedures for 02/05/2024  OCT, Retina - OU - Both Eyes       Right Eye Quality was good. Central Foveal Thickness: 287. Progression has no prior data. Findings include normal foveal contour, no IRF, no SRF, vitreomacular adhesion .    Left Eye Quality was good. Central Foveal Thickness: 269. Progression has no prior data. Findings include  normal foveal contour, no IRF, no SRF.   Notes *Images captured and stored on drive  Diagnosis / Impression:  NFP; no IRF/SRF OU  Clinical management:  See below  Abbreviations: NFP - Normal foveal profile. CME - cystoid macular edema. PED - pigment epithelial detachment. IRF - intraretinal fluid. SRF - subretinal fluid. EZ - ellipsoid zone. ERM - epiretinal membrane. ORA - outer retinal atrophy. ORT - outer retinal tubulation. SRHM - subretinal hyper-reflective material. IRHM - intraretinal hyper-reflective material           ASSESSMENT/PLAN:   ICD-10-CM   1. Transient vision disturbance  H53.9 OCT, Retina - OU - Both Eyes    2. Combined forms of age-related cataract of both eyes  H25.813      Transient Vision Disturbance OD - Last eye exam w/ JDM 08.15.25 - Pt reports waking up morning of 01.10.26 with VA loss in OD. Went to ED and had stroke work up. Specifies episode lasted about a minute, central vision spared during episodes. Episode reoccurred yesterday midday, had FOL in central [place] this time. Lasting about a minute - Pt reports no h/o headaches or eye issues, takes no prescription medications or supplements; hx of cardiac ablation for SVT, released by cardiologist  - OCT showed NFP, no IRF/SRF OU - suspect ocular migraine -- recommend eval with Neurology for migraine eval and management - Pt has scheduled f/u w/ neuro  - No retinal intervention recommended at this time - f/u 4-6wks, sooner PRN, DFE, OCT, possible FA trans OD  2. Mixed Cataract OU - The symptoms of cataract, surgical options, and treatments and risks were discussed with patient. - discussed diagnosis and progression - monitor   Ophthalmic Meds Ordered this visit:  No orders of the defined types were placed in this encounter.    Return for 4-6weeks transient VA loss OD, DFE, OCT, poss FA  trans OD.  There are no Patient Instructions on file for this visit.  Explained the diagnoses, plan, and follow up with the patient and they expressed understanding.  Patient expressed understanding of the importance of proper follow up care.   This document serves as a record of services personally performed by Redell JUDITHANN Hans, MD, PhD. It was created on their behalf by Almetta Pesa, an ophthalmic technician. The creation of this record is the provider's dictation and/or activities during the visit.    Electronically signed by: Almetta Pesa, OA, 02/16/24  11:24 PM  Redell JUDITHANN Hans, M.D., Ph.D. Diseases & Surgery of the Retina and Vitreous [facility] 02/05/2024  I have reviewed the above documentation for accuracy and completeness, and I agree with the above. Redell JUDITHANN Hans, M.D., Ph.D. 02/16/24 11:27 PM   Abbreviations: M myopia (nearsighted); A astigmatism; H hyperopia (farsighted); P presbyopia; Mrx spectacle prescription;  CTL contact lenses; OD right eye; OS left eye; OU both eyes  XT exotropia; ET esotropia; PEK punctate epithelial keratitis; PEE punctate epithelial erosions; DES dry eye syndrome; MGD meibomian gland dysfunction; ATs artificial tears; PFAT's preservative free artificial tears; NSC nuclear sclerotic cataract; PSC posterior subcapsular cataract; ERM epi-retinal membrane; PVD posterior vitreous detachment; RD retinal detachment; DM diabetes mellitus; DR diabetic retinopathy; NPDR non-proliferative diabetic retinopathy; PDR proliferative diabetic retinopathy; CSME clinically significant macular edema; DME diabetic macular edema; dbh dot blot hemorrhages; CWS cotton wool spot; POAG primary open angle glaucoma; C/D cup-to-disc ratio; HVF humphrey visual field; GVF goldmann visual field; OCT optical coherence tomography; IOP intraocular pressure; BRVO Branch retinal  vein occlusion; CRVO central retinal vein occlusion; CRAO central retinal artery  occlusion; BRAO branch retinal artery occlusion; RT retinal tear; SB scleral buckle; PPV pars plana vitrectomy; VH Vitreous hemorrhage; PRP panretinal laser photocoagulation; IVK intravitreal kenalog; VMT vitreomacular traction; MH Macular hole;  NVD neovascularization of the disc; NVE neovascularization elsewhere; AREDS age related eye disease study; ARMD age related macular degeneration; POAG primary open angle glaucoma; EBMD epithelial/anterior basement membrane dystrophy; ACIOL anterior chamber intraocular lens; IOL intraocular lens; PCIOL posterior chamber intraocular lens; Phaco/IOL phacoemulsification with intraocular lens placement; PRK photorefractive keratectomy; LASIK laser assisted in situ keratomileusis; HTN hypertension; DM diabetes mellitus; COPD chronic obstructive pulmonary disease      [1]  Allergies Allergen Reactions   Penicillins Other (See Comments)    Childhood allergy   Did it involve swelling of the face/tongue/throat, SOB, or low BP? Unknown Did it involve sudden or severe rash/hives, skin peeling, or any reaction on the inside of your mouth or nose? Unknown Did you need to seek medical attention at a hospital or doctor's office? Unknown When did it last happen?       If all above answers are NO, may proceed with cephalosporin use.   [2]  Social History Tobacco Use   Smoking status: Never   Smokeless tobacco: Never  Vaping Use   Vaping status: Never Used  Substance Use Topics   Alcohol use: No   Drug use: No   "

## 2024-02-16 ENCOUNTER — Encounter (INDEPENDENT_AMBULATORY_CARE_PROVIDER_SITE_OTHER): Payer: Self-pay | Admitting: Ophthalmology

## 2024-02-19 NOTE — Progress Notes (Shared)
 " [facility] - Clinic Note  03/04/2024   CHIEF COMPLAINT Patient presents for No chief complaint on file.  HISTORY OF PRESENT ILLNESS: Calvin Conner is a 69 y.o. male who presents to the clinic today for:   Pt states   Referring physician: Roanna Ezekiel NOVAK, MD [address] [place],  [place] [address]  HISTORICAL INFORMATION:  Selected notes from the MEDICAL RECORD NUMBER Referred for ED f/u following transient vision loss OD LEE:  Ocular Hx- PMH-   CURRENT MEDICATIONS: No current outpatient medications on file. (Ophthalmic Drugs)   No current facility-administered medications for this visit. (Ophthalmic Drugs)   Current Outpatient Medications (Other)  Medication Sig   esomeprazole  (NEXIUM ) 40 MG capsule Take 1 capsule by mouth once daily (Patient not taking: Reported on 02/05/2024)   No current facility-administered medications for this visit. (Other)   REVIEW OF SYSTEMS:   ALLERGIES Allergies[1] PAST MEDICAL HISTORY Past Medical History:  Diagnosis Date   Anxiety    Cardiac arrhythmia    Depression    Fibromyalgia    GERD (gastroesophageal reflux disease)    w/ LA Grade A erosive esophagitis   Hiatal hernia    IBS (irritable bowel syndrome)    Internal hemorrhoids    Past Surgical History:  Procedure Laterality Date   LAPAROSCOPIC NISSEN FUNDOPLICATION  11/19/2008   Dr Krystal Russell   LOOP RECORDER INSERTION N/A 10/26/2016   Procedure: LOOP RECORDER INSERTION;  Surgeon: Waddell Danelle ORN, MD;  Location: [facility] INVASIVE CV [facility];  Service: Cardiovascular;  Laterality: N/A;   SVT ABLATION N/A 03/28/2018   Procedure: SVT ABLATION;  Surgeon: Waddell Danelle ORN, MD;  Location: [facility] INVASIVE CV LAB;  Service: Cardiovascular;  Laterality: N/A;   FAMILY HISTORY Family History  Problem Relation Age of Onset   Breast cancer Mother    Colon cancer Neg Hx    Esophageal cancer Neg Hx    Pancreatic cancer Neg Hx    Stomach cancer Neg Hx     Liver disease Neg Hx    SOCIAL HISTORY Social History[2]     OPHTHALMIC EXAM:  Not recorded    IMAGING AND PROCEDURES  Imaging and Procedures for 03/04/2024         ASSESSMENT/PLAN: No diagnosis found.  Transient Vision Disturbance OD - Last eye exam w/ [facility]DM 08.15.25 - Pt reports waking up morning of 01.10.26 with VA loss in OD. Went to ED and had stroke work up. Specifies episode lasted about a minute, central vision spared during episodes. Episode reoccurred yesterday midday, had FOL in central VA this time. Lasting about a minute - Pt reports no h/o headaches or eye issues, takes no prescription medications or supplements; hx of cardiac ablation for SVT, released by cardiologist  - OCT showed NFP, no IRF/SRF OU - suspect ocular migraine -- recommend eval with Neurology for migraine eval and management - Pt has scheduled f/u w/ neuro  - No retinal intervention recommended at this time - f/u 4-6wks, sooner PRN, DFE, OCT, possible FA trans OD  2. Mixed Cataract OU - The symptoms of cataract, surgical options, and treatments and risks were discussed with patient. - discussed diagnosis and progression - monitor   Ophthalmic Meds Ordered this visit:  No orders of the defined types were placed in this encounter.    No follow-ups on file.  There are no Patient Instructions on file for this visit.  Explained the diagnoses, plan, and follow up with the  patient and they expressed understanding.  Patient expressed understanding of the importance of proper follow up care.   This document serves as a record of services personally performed by Redell JUDITHANN Hans, MD, PhD. It was created on their behalf by Almetta Pesa, an ophthalmic technician. The creation of this record is the provider's dictation and/or activities during the visit.    Electronically signed by: Almetta Pesa, OA, 02/19/24  1:21 PM  Redell JUDITHANN Hans, M.D., Ph.D. Diseases & Surgery of the Retina and  Vitreous [facility] 03/04/2024   Abbreviations: M myopia (nearsighted); A astigmatism; H hyperopia (farsighted); P presbyopia; Mrx spectacle prescription;  CTL contact lenses; OD right eye; OS left eye; OU both eyes  XT exotropia; ET esotropia; PEK punctate epithelial keratitis; PEE punctate epithelial erosions; DES dry eye syndrome; MGD meibomian gland dysfunction; ATs artificial tears; PFAT's preservative free artificial tears; NSC nuclear sclerotic cataract; PSC posterior subcapsular cataract; ERM epi-retinal membrane; PVD posterior vitreous detachment; RD retinal detachment; DM diabetes mellitus; DR diabetic retinopathy; NPDR non-proliferative diabetic retinopathy; PDR proliferative diabetic retinopathy; CSME clinically significant macular edema; DME diabetic macular edema; dbh dot blot hemorrhages; CWS cotton wool spot; POAG primary open angle glaucoma; C/D cup-to-disc ratio; HVF humphrey visual field; GVF goldmann visual field; OCT optical coherence tomography; IOP intraocular pressure; BRVO Branch retinal vein occlusion; CRVO central retinal vein occlusion; CRAO central retinal artery occlusion; BRAO branch retinal artery occlusion; RT retinal tear; SB scleral buckle; PPV pars plana vitrectomy; VH Vitreous hemorrhage; PRP panretinal laser photocoagulation; IVK intravitreal kenalog; VMT vitreomacular traction; MH Macular hole;  NVD neovascularization of the disc; NVE neovascularization elsewhere; AREDS age related eye disease study; ARMD age related macular degeneration; POAG primary open angle glaucoma; EBMD epithelial/anterior basement membrane dystrophy; ACIOL anterior chamber intraocular lens; IOL intraocular lens; PCIOL posterior chamber intraocular lens; Phaco/IOL phacoemulsification with intraocular lens placement; PRK photorefractive keratectomy; LASIK laser assisted in situ keratomileusis; HTN hypertension; DM diabetes mellitus; COPD chronic obstructive pulmonary disease       [1]  Allergies Allergen Reactions   Penicillins Other (See Comments)    Childhood allergy   Did it involve swelling of the face/tongue/throat, SOB, or low BP? Unknown Did it involve sudden or severe rash/hives, skin peeling, or any reaction on the inside of your mouth or nose? Unknown Did you need to seek medical attention at a hospital or doctor's office? Unknown When did it last happen?       If all above answers are NO, may proceed with cephalosporin use.   [2]  Social History Tobacco Use   Smoking status: Never   Smokeless tobacco: Never  Vaping Use   Vaping status: Never Used  Substance Use Topics   Alcohol use: No   Drug use: No   "

## 2024-02-20 NOTE — Progress Notes (Signed)
 "  [facility]  PATIENT: Calvin Conner DOB: 1955/08/13  REFERRING DOCTOR OR PCP:  Ozell Marine, DO; Ezekiel Charleston, MD SOURCE: Patient, notes from recent hospitalization, imaging and lab reports, CT scan images personally reviewed  _________________________________   HISTORICAL  CHIEF COMPLAINT:  Chief Complaint  Patient presents with   RM10    Pt is here Alone. Pt states that his vision loss in his right eye happened 2 times and lasted 1 min.     HISTORY OF PRESENT ILLNESS:  I had the pleasure of seeing your patient, Calvin Conner, at [facility] for neurologic consultation regarding his 2 episodes of transient visual loss on the right  He is a 70 year old man with a history of SVT status post ablation who presented to the emergency room on 02/01/2024 with right visual loss.  He woke up around 330 that morning with painless visual loss in the right eye.  This lasted about 1 minute before completely resolving.  He was not on any antiplatelet medication at that time.  He denied any pain.  In the emergency room he had a CT scan of the head and CT angio.  Additionally, sed rate was done and was normal.  Three days later he had another episode also lasting about 1 minute.   He notes the visual loss starts on the right half of the visual field and comes in. However, the very center has sone vision with scintillations.    He recently saw ophthalmology.  Corrected vision was normal.  He reports no abnormality was noted in the eye.  We do not have the consultation but he reports being told that the events likely represent vasospasm.   Vascular risks:  No HTN, DM, tobacco history.  No CAD, Afib.   He had an ablation for SVT in 2020 and has had a normal rhythm since.     He had been on metoprolol  which was d/c after the ablation.    He has a h/o viral gastritis and still has GI symptoms at times.      Imaging: CT scan of the head and CT angiogram of the  intracranial arteries 02/01/2024 was normal for age.  No acute findings.  No significant stenosis.   REVIEW OF SYSTEMS: Constitutional: No fevers, chills, sweats, or change in appetite Eyes: No visual changes, double vision, eye pain Ear, nose and throat: No hearing loss, ear pain, nasal congestion, sore throat Cardiovascular: No chest pain, palpitations Respiratory:  No shortness of breath at rest or with exertion.   No wheezes GastrointestinaI: No nausea, vomiting, diarrhea, abdominal pain, fecal incontinence Genitourinary:  No dysuria, urinary retention or frequency.  No nocturia. Musculoskeletal:  No neck pain, back pain Integumentary: No rash, pruritus, skin lesions Neurological: as above Psychiatric: No depression at this time.  No anxiety Endocrine: No palpitations, diaphoresis, change in appetite, change in weigh or increased thirst Hematologic/Lymphatic:  No anemia, purpura, petechiae. Allergic/Immunologic: No itchy/runny eyes, nasal congestion, recent allergic reactions, rashes  ALLERGIES: Allergies[1]  HOME MEDICATIONS: Current Medications[2]  PAST MEDICAL HISTORY: Past Medical History:  Diagnosis Date   Anxiety    Cardiac arrhythmia    Depression    Fibromyalgia    GERD (gastroesophageal reflux disease)    w/ LA Grade A erosive esophagitis   Hiatal hernia    IBS (irritable bowel syndrome)    Internal hemorrhoids     PAST SURGICAL HISTORY: Past Surgical History:  Procedure Laterality Date   LAPAROSCOPIC NISSEN FUNDOPLICATION  11/19/2008   Dr Krystal Russell   LOOP RECORDER INSERTION N/A 10/26/2016   Procedure: LOOP RECORDER INSERTION;  Surgeon: Waddell Danelle ORN, MD;  Location: [facility] INVASIVE CV LAB;  Service: Cardiovascular;  Laterality: N/A;   SVT ABLATION N/A 03/28/2018   Procedure: SVT ABLATION;  Surgeon: Waddell Danelle ORN, MD;  Location: [facility] INVASIVE CV LAB;  Service: Cardiovascular;  Laterality: N/A;    FAMILY HISTORY: Family History  Problem Relation Age of  Onset   Breast cancer Mother    Colon cancer Neg Hx    Esophageal cancer Neg Hx    Pancreatic cancer Neg Hx    Stomach cancer Neg Hx    Liver disease Neg Hx     SOCIAL HISTORY: Social History   Socioeconomic History   Marital status: Married    Spouse name: Not on file   Number of children: 2   Years of education: Not on file   Highest education level: Not on file  Occupational History   Not on file  Tobacco Use   Smoking status: Never   Smokeless tobacco: Never  Vaping Use   Vaping status: Never Used  Substance and Sexual Activity   Alcohol use: No   Drug use: No   Sexual activity: Not on file  Other Topics Concern   Not on file  Social History Narrative   Not on file   Social Drivers of Health   Tobacco Use: Low Risk (02/21/2024)   Patient History    Smoking Tobacco Use: Never    Smokeless Tobacco Use: Never    Passive Exposure: Not on file  Financial Resource Strain: Not on file  Food Insecurity: Not on file  Transportation Needs: Not on file  Physical Activity: Not on file  Stress: Not on file  Social Connections: Not on file  Intimate Partner Violence: Not on file  Depression (EYV7-0): Not on file  Alcohol Screen: Not on file  Housing: Not on file  Utilities: Not on file  Health Literacy: Not on file       PHYSICAL EXAM  Vitals:   02/21/24 1025  BP: 127/77  Weight: 214 lb (97.1 kg)  Height: 6' 2 (1.88 m)    Body mass index is 27.48 kg/m.   General: The patient is well-developed and well-nourished and in no acute distress  HEENT:  Head is [place]/AT.  Sclera are anicteric.  Funduscopic exam shows normal optic discs and retinal vessels.  Neck: No carotid bruits are noted.  The neck is nontender.  Cardiovascular: The heart has a regular rate and rhythm with a normal S1 and S2. There were no murmurs, gallops or rubs.    Skin: Extremities are without rash or  edema.  Musculoskeletal:  Back is nontender  Neurologic Exam  Mental status:  The patient is alert and oriented x 3 at the time of the examination. The patient has apparent normal recent and remote memory, with an apparently normal attention span and concentration ability.   Speech is normal.  Cranial nerves: Extraocular movements are full. Pupils are equal, round, and reactive to light and accomodation.  Visual fields are full  There is good facial sensation to soft touch bilaterally.Facial strength is normal.  Trapezius and sternocleidomastoid strength is normal. No dysarthria is noted.  The tongue is midline, and the patient has symmetric elevation of the soft palate. No obvious hearing deficits are noted.  Motor:  Muscle bulk is normal.   Tone is normal. Strength is  5 / 5  in all 4 extremities.   Sensory: Sensory testing is intact to pinprick, soft touch and vibration sensation in all 4 extremities.  Coordination: Cerebellar testing reveals good finger-nose-finger and heel-to-shin bilaterally.  Gait and station: Station is normal.   Gait is normal. Tandem gait is normal. Romberg is negative.   Reflexes: Deep tendon reflexes are symmetric and normal bilaterally.       DIAGNOSTIC DATA (LABS, IMAGING, TESTING) - I reviewed patient records, labs, notes, testing and imaging myself where available.  Lab Results  Component Value Date   WBC 4.4 02/01/2024   HGB 14.8 02/01/2024   HCT 42.5 02/01/2024   MCV 88.7 02/01/2024   PLT 178 02/01/2024      Component Value Date/Time   NA 139 02/01/2024 0857   K 3.9 02/01/2024 0857   CL 104 02/01/2024 0857   CO2 24 02/01/2024 0857   GLUCOSE 106 (H) 02/01/2024 0857   BUN 17 02/01/2024 0857   CREATININE 1.00 02/01/2024 0857   CALCIUM 9.8 02/01/2024 0857   PROT 7.6 02/01/2024 0857   ALBUMIN 4.5 02/01/2024 0857   AST 22 02/01/2024 0857   ALT 26 02/01/2024 0857   ALKPHOS 57 02/01/2024 0857   BILITOT 0.5 02/01/2024 0857   GFRNONAA >60 02/01/2024 0857   GFRAA >60 03/18/2018 0845   Lab Results  Component Value Date    CHOL 174 10/24/2016   HDL 54 10/24/2016   LDLCALC 114 (H) 10/24/2016   TRIG 32 10/24/2016   CHOLHDL 3.2 10/24/2016   Lab Results  Component Value Date   HGBA1C 5.2 10/24/2016   No results found for: [id number] Lab Results  Component Value Date   TSH 3.571 10/24/2016       ASSESSMENT AND PLAN  TIA (transient ischemic attack)  Transient visual loss of right eye  Status post ablation of accessory bypass tract    In summary, Calvin Conner is a 69 year old man who has had 2 transient episodes of right visual loss.  Given his age and the sudden onset and resolution, TIA is the most likely diagnosis.  Therefore, I have started Plavix  (chosen over aspirin  as he has a history of gastritis).  We did discuss that if GI symptoms flare again in the future that Pepcid  would be preferred over Nexium  due to potential interaction on pharmacokinetics of Plavix .  If an additional episode occurs, consider MRI of the brain to determine if there has been other vascular events.  Would consider DAPT.  Vasospasm/migraine is possible though the presentation at age makes TIA more likely.  Would consider adding verapamil if episodes continue to occur on DAPT.  He will return to see me as needed based on response to treatment.  He should call for new or worsening neurologic symptoms. Calvin Starry A. Vear, MD, [facility] 02/21/2024, 1:19 PM Certified in Neurology, Clinical Neurophysiology, Sleep Medicine and Neuroimaging  [facility] [address] [place], [place] [address] [phone]     [1]  Allergies Allergen Reactions   Penicillins Other (See Comments)    Childhood allergy   Did it involve swelling of the face/tongue/throat, SOB, or low BP? Unknown Did it involve sudden or severe rash/hives, skin peeling, or any reaction on the inside of your mouth or nose? Unknown Did you need to seek medical attention at a hospital or doctor's office? Unknown When did it last happen?        If all above answers are NO, may proceed with cephalosporin use.   [2]  Current  Outpatient Medications:    clopidogrel  (PLAVIX ) 75 MG tablet, Take 1 tablet (75 mg total) by mouth daily., Disp: 30 tablet, Rfl: 11  "

## 2024-02-21 ENCOUNTER — Ambulatory Visit: Admitting: Neurology

## 2024-02-21 ENCOUNTER — Encounter: Payer: Self-pay | Admitting: Neurology

## 2024-02-21 VITALS — BP 127/77 | Ht 74.0 in | Wt 214.0 lb

## 2024-02-21 DIAGNOSIS — G459 Transient cerebral ischemic attack, unspecified: Secondary | ICD-10-CM | POA: Diagnosis not present

## 2024-02-21 DIAGNOSIS — H53121 Transient visual loss, right eye: Secondary | ICD-10-CM | POA: Diagnosis not present

## 2024-02-21 DIAGNOSIS — Z9889 Other specified postprocedural states: Secondary | ICD-10-CM | POA: Diagnosis not present

## 2024-02-21 MED ORDER — CLOPIDOGREL BISULFATE 75 MG PO TABS: 75.0000 mg | ORAL_TABLET | Freq: Every day | ORAL | 11 refills | Status: AC

## 2024-03-04 ENCOUNTER — Encounter (INDEPENDENT_AMBULATORY_CARE_PROVIDER_SITE_OTHER): Admitting: Ophthalmology

## 2024-03-04 DIAGNOSIS — H539 Unspecified visual disturbance: Secondary | ICD-10-CM

## 2024-03-04 DIAGNOSIS — H25813 Combined forms of age-related cataract, bilateral: Secondary | ICD-10-CM
